# Patient Record
Sex: Female | Born: 1980 | Hispanic: Yes | Marital: Single | Smoking: Former smoker
Health system: Southern US, Community
[De-identification: ages and names within clinical notes are randomized; demographics above are authoritative.]

## PROBLEM LIST (undated history)

## (undated) DIAGNOSIS — G4730 Sleep apnea, unspecified: Secondary | ICD-10-CM

---

## 2014-09-03 ENCOUNTER — Encounter: Payer: Self-pay | Admitting: Obstetrics & Gynecology

## 2014-09-03 ENCOUNTER — Ambulatory Visit (INDEPENDENT_AMBULATORY_CARE_PROVIDER_SITE_OTHER): Payer: BLUE CROSS/BLUE SHIELD | Admitting: Obstetrics & Gynecology

## 2014-09-03 VITALS — BP 106/69 | HR 83 | Ht 64.0 in | Wt 146.0 lb

## 2014-09-03 DIAGNOSIS — Z30430 Encounter for insertion of intrauterine contraceptive device: Secondary | ICD-10-CM | POA: Diagnosis not present

## 2014-09-03 DIAGNOSIS — Z01812 Encounter for preprocedural laboratory examination: Secondary | ICD-10-CM

## 2014-09-03 LAB — POCT URINE PREGNANCY: Preg Test, Ur: NEGATIVE

## 2014-09-03 NOTE — Progress Notes (Signed)
   Subjective:    Patient ID: Kendra Martin, female    DOB: 07-04-1980, 34 y.o.   MRN: [id number]  HPI  34 yo lady is here to have Mirena inserted. She is waitlisted at [employer] and she doesn't want a pregnancy any time soon. She is currently using Nuvaring.  Review of Systems She reports a recent normal pap smear at student health.    Objective:   Physical Exam  UPT negative, consent signed, Time out procedure done. Cervix prepped with betadine and grasped with a single tooth tenaculum. Mirena was easily placed and the strings were cut to 3-4 cm. Uterus sounded to 9 cm. She tolerated the procedure well.        Assessment & Plan:  Contraception- Mirena

## 2015-03-27 ENCOUNTER — Emergency Department
Admission: EM | Admit: 2015-03-27 | Discharge: 2015-03-27 | Disposition: A | Discharge status: Home / Self Care | Payer: Worker's Compensation | Attending: Emergency Medicine | Admitting: Emergency Medicine

## 2015-03-27 ENCOUNTER — Encounter: Payer: Self-pay | Admitting: Emergency Medicine

## 2015-03-27 DIAGNOSIS — S61052A Open bite of left thumb without damage to nail, initial encounter: Secondary | ICD-10-CM | POA: Insufficient documentation

## 2015-03-27 DIAGNOSIS — W5501XA Bitten by cat, initial encounter: Secondary | ICD-10-CM | POA: Diagnosis not present

## 2015-03-27 DIAGNOSIS — Y9289 Other specified places as the place of occurrence of the external cause: Secondary | ICD-10-CM | POA: Diagnosis not present

## 2015-03-27 DIAGNOSIS — Y9389 Activity, other specified: Secondary | ICD-10-CM | POA: Diagnosis not present

## 2015-03-27 DIAGNOSIS — Z23 Encounter for immunization: Secondary | ICD-10-CM | POA: Diagnosis not present

## 2015-03-27 DIAGNOSIS — Y990 Civilian activity done for income or pay: Secondary | ICD-10-CM | POA: Diagnosis not present

## 2015-03-27 DIAGNOSIS — S61032A Puncture wound without foreign body of left thumb without damage to nail, initial encounter: Secondary | ICD-10-CM | POA: Insufficient documentation

## 2015-03-27 MED ORDER — AMOXICILLIN-POT CLAVULANATE 875-125 MG PO TABS: 1.0000 | ORAL_TABLET | Freq: Two times a day (BID) | ORAL | Status: AC

## 2015-03-27 MED ORDER — AMOXICILLIN-POT CLAVULANATE 875-125 MG PO TABS
1.0000 | ORAL_TABLET | Freq: Once | ORAL | Status: AC
  Administered 2015-03-27: 1 via ORAL
  Filled 2015-03-27: qty 1

## 2015-03-27 MED ORDER — IBUPROFEN 800 MG PO TABS: 800.0000 mg | ORAL_TABLET | Freq: Three times a day (TID) | ORAL | Status: AC | PRN

## 2015-03-27 MED ORDER — RABIES IMMUNE GLOBULIN 150 UNIT/ML IM INJ
20.0000 [IU]/kg | INJECTION | Freq: Once | INTRAMUSCULAR | Status: AC
  Administered 2015-03-27: 1425 [IU] via INTRAMUSCULAR
  Filled 2015-03-27: qty 9.5

## 2015-03-27 MED ORDER — RABIES VACCINE, PCEC IM SUSR
1.0000 mL | Freq: Once | INTRAMUSCULAR | Status: AC
  Administered 2015-03-27: 1 mL via INTRAMUSCULAR
  Filled 2015-03-27: qty 1

## 2015-03-27 NOTE — ED Notes (Signed)
Pt here with a cat bite on her hand, pt states that she was bitten by the cat at work today. Pt works at [employer]. 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

## 2015-03-27 NOTE — ED Notes (Signed)
Per Patient's workers comp claim, patient does not need urine drug screen, breath analysis, or blood alcohol completed.

## 2015-03-27 NOTE — ED Provider Notes (Signed)
[facility] Emergency Department Provider Note  ____________________________________________  Time seen: Approximately 5:48 PM  I have reviewed the triage vital signs and the nursing notes.   HISTORY  Chief Complaint Animal Bite   HPI Kendra Martin is a 34 y.o. 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 Was euthanized with the head sent to [facility] for evaluation. Patient presents for rabies postexposure vaccination series. Patient states that she copiously cleaned and washed and irrigated the area for 5 minutes post bite. No active bleeding at this time.   History reviewed. No pertinent past medical history.  There are no active problems to display for this patient.   History reviewed. No pertinent past surgical history.  Current Outpatient Rx  Name  Route  Sig  Dispense  Refill  . amoxicillin-clavulanate (AUGMENTIN) 875-125 MG tablet   Oral   Take 1 tablet by mouth 2 (two) times daily.   14 tablet   0   . ibuprofen (ADVIL,MOTRIN) 800 MG tablet   Oral   Take 1 tablet (800 mg total) by mouth every 8 (eight) hours as needed.   30 tablet   0   . NUVARING 0.12-0.015 MG/24HR vaginal ring            3     Dispense as written.     Allergies Review of patient's allergies indicates no known allergies.  Family History  Problem Relation Age of Onset  . Alcohol abuse Paternal Grandfather   . Diabetes Paternal Grandmother   . Heart disease Paternal Grandmother   . Heart attack Paternal Grandmother   . Alcohol abuse Maternal Grandfather   . Hyperlipidemia Father   . Hypertension Mother     Social History Social History  Substance Use Topics  . Smoking status: Former Games developer  . Smokeless tobacco: Never Used  . Alcohol Use: 0.0 oz/week    0 Standard drinks or equivalent per week     Comment: social    Review of Systems Constitutional:  No fever/chills Eyes: No visual changes. ENT: No sore throat. Cardiovascular: Denies chest pain. Respiratory: Denies shortness of breath. Gastrointestinal: No abdominal pain.  No nausea, no vomiting.  No diarrhea.  No constipation. Genitourinary: Negative for dysuria. Musculoskeletal: Negative for back pain. Skin: The thumb with puncture wound noted. Neurological: Negative for headaches, focal weakness or numbness.  10-point ROS otherwise negative.  ____________________________________________   PHYSICAL EXAM:  VITAL SIGNS: ED Triage Vitals  Enc Vitals Group     BP --      Pulse --      Resp --      Temp --      Temp src --      SpO2 --      Weight --      Height --      Head Cir --      Peak Flow --      Pain Score --      Pain Loc --      Pain Edu? --      Excl. in GC? --     Constitutional: Alert and oriented. Well appearing and in no acute distress.  Cardiovascular: Normal rate, regular rhythm. Grossly normal heart sounds.  Good peripheral circulation. Respiratory: Normal respiratory effort.  No retractions. Lungs CTAB. Musculoskeletal: No lower extremity tenderness nor edema.  No joint effusions. Neurologic:  Normal speech and  language. No gross focal neurologic deficits are appreciated. No gait instability. Skin:  Skin is warm, dry and intact. No bleeding noted. No evidence of infection at this time. Psychiatric: Mood and affect are normal. Speech and behavior are normal.  ____________________________________________   LABS (all labs ordered are listed, but only abnormal results are displayed)  Labs Reviewed - No data to display ____________________________________________   PROCEDURES  Procedure(s) performed: None  Critical Care performed: No  ____________________________________________   INITIAL IMPRESSION / ASSESSMENT AND PLAN / ED COURSE  Pertinent labs & imaging results that were available during my care of the patient were reviewed by me  and considered in my medical decision making (see chart for details).  Cat Bite Rx given for Augmentin 875 twice a day rabies vaccination series started. She is to return as directed as she voices no other emergency medical complaints at this time. ____________________________________________   FINAL CLINICAL IMPRESSION(S) / ED DIAGNOSES  Final diagnoses:  Cat bite, initial encounter      Evangeline Dakin, PA-C 03/27/15 1957  Sharyn Creamer, MD 04/05/15 [phone]

## 2015-03-27 NOTE — Discharge Instructions (Signed)

## 2015-03-30 ENCOUNTER — Encounter: Payer: Self-pay | Admitting: Emergency Medicine

## 2015-03-30 ENCOUNTER — Emergency Department
Admission: EM | Admit: 2015-03-30 | Discharge: 2015-03-30 | Disposition: A | Discharge status: Home / Self Care | Payer: Worker's Compensation | Attending: Emergency Medicine | Admitting: Emergency Medicine

## 2015-03-30 DIAGNOSIS — Z23 Encounter for immunization: Secondary | ICD-10-CM | POA: Diagnosis present

## 2015-03-30 DIAGNOSIS — Z87891 Personal history of nicotine dependence: Secondary | ICD-10-CM | POA: Diagnosis not present

## 2015-03-30 DIAGNOSIS — Z79899 Other long term (current) drug therapy: Secondary | ICD-10-CM | POA: Diagnosis not present

## 2015-03-30 DIAGNOSIS — Z792 Long term (current) use of antibiotics: Secondary | ICD-10-CM | POA: Diagnosis not present

## 2015-03-30 MED ORDER — RABIES VACCINE, PCEC IM SUSR
1.0000 mL | Freq: Once | INTRAMUSCULAR | Status: AC
  Administered 2015-03-30: 1 mL via INTRAMUSCULAR
  Filled 2015-03-30: qty 1

## 2015-03-30 NOTE — ED Notes (Signed)
PT returned to the ER to receive second rabies vaccine after presenting to this ER last week with an animal bite

## 2015-03-30 NOTE — ED Notes (Signed)
Cat bite 9/28

## 2015-03-30 NOTE — ED Provider Notes (Signed)
[facility] Emergency Department Provider Note ____________________________________________  Time seen: 2030  I have reviewed the triage vital signs and the nursing notes.  HISTORY  Chief Complaint  Rabies Injection  HPI Kendra Martin is a 34 y.o. female reports to the ED for rabies vaccine #2 of 4. She denies any fever, chills, sweats, or vaccine reaction in the interim.  History reviewed. No pertinent past medical history.  There are no active problems to display for this patient.  History reviewed. No pertinent past surgical history.  Current Outpatient Rx  Name  Route  Sig  Dispense  Refill  . amoxicillin-clavulanate (AUGMENTIN) 875-125 MG tablet   Oral   Take 1 tablet by mouth 2 (two) times daily.   14 tablet   0   . ibuprofen (ADVIL,MOTRIN) 800 MG tablet   Oral   Take 1 tablet (800 mg total) by mouth every 8 (eight) hours as needed.   30 tablet   0   . NUVARING 0.12-0.015 MG/24HR vaginal ring            3     Dispense as written.    Allergies Review of patient's allergies indicates no known allergies.  Family History  Problem Relation Age of Onset  . Alcohol abuse Paternal Grandfather   . Diabetes Paternal Grandmother   . Heart disease Paternal Grandmother   . Heart attack Paternal Grandmother   . Alcohol abuse Maternal Grandfather   . Hyperlipidemia Father   . Hypertension Mother    Social History Social History  Substance Use Topics  . Smoking status: Former Games developer  . Smokeless tobacco: Never Used  . Alcohol Use: 0.0 oz/week    0 Standard drinks or equivalent per week     Comment: social   Review of Systems  Constitutional: Negative for fever. Eyes: Negative for visual changes. ENT: Negative for sore throat. Cardiovascular: Negative for chest pain. Respiratory: Negative for shortness of breath. Gastrointestinal: Negative for abdominal pain, vomiting and diarrhea. Genitourinary: Negative for dysuria. Musculoskeletal:  Negative for back pain. Skin: Negative for rash. Neurological: Negative for headaches, focal weakness or numbness. ____________________________________________  PHYSICAL EXAM:  VITAL SIGNS: ED Triage Vitals  Enc Vitals Group     BP 03/30/15 1843 116/73 mmHg     Pulse Rate 03/30/15 1843 86     Resp 03/30/15 1843 20     Temp 03/30/15 1843 98.1 F (36.7 C)     Temp Source 03/30/15 1843 Oral     SpO2 03/30/15 1843 98 %     Weight 03/30/15 1843 155 lb (70.308 kg)     Height 03/30/15 1843  (1.626 m)     Head Cir --      Peak Flow --      Pain Score --      Pain Loc --      Pain Edu? --      Excl. in GC? --    Constitutional: Alert and oriented. Well appearing and in no distress. Eyes: Conjunctivae are normal. PERRL. Normal extraocular movements. ENT   Head: Normocephalic and atraumatic.   Nose: No congestion/rhinorrhea.   Mouth/Throat: Mucous membranes are moist.   Neck: Supple. No thyromegaly. Hematological/Lymphatic/Immunological: No cervical lymphadenopathy. Cardiovascular: Normal rate, regular rhythm.  Respiratory: Normal respiratory effort. No wheezes/rales/rhonchi. Gastrointestinal: Soft and nontender. No distention. Musculoskeletal: Nontender with normal range of motion in all extremities.  Neurologic:  Normal gait without ataxia. Normal speech and language. No gross focal neurologic deficits are appreciated. Skin:  Skin is warm, dry and intact. No rash noted. Psychiatric: Mood and affect are normal. Patient exhibits appropriate insight and judgment. ____________________________________________  PROCEDURES  Rabavert IM x 1 ____________________________________________  INITIAL IMPRESSION / ASSESSMENT AND PLAN / ED COURSE  Encounter for rabies vaccine #2 of 4. Patient will return to the ED for vaccine #3 of 4 on 04/03/15. She will return for vaccine #4 of 4 on 04/10/15. ____________________________________________  FINAL CLINICAL IMPRESSION(S) / ED  DIAGNOSES  Final diagnoses:  Need for immunization against rabies     Lissa Hoard, PA-C 03/30/15 2134  Minna Antis, MD 03/30/15 2325

## 2015-03-30 NOTE — Discharge Instructions (Signed)
Rabies Vaccine What You Need to Know WHAT IS RABIES?  Rabies is a serious disease. It is caused by a virus.  Rabies is mainly a disease of animals. Humans get rabies when they are bitten by infected animals.  At first there might not be any symptoms. But weeks, or even years after a bite, rabies can cause pain, fatigue, headaches, fever, and irritability. These are followed by seizures, hallucinations, and paralysis. Human rabies is almost always fatal.  Wild animals, especially bats, are the most common source of human rabies infection in the [place]. Skunks, raccoons, dogs, cats, coyotes, foxes, and other mammals can also transmit the disease.  Human rabies is rare in the [place]. There have been only 43 cases diagnosed since 1990. However, between 16,000 and 39,000 people are vaccinated each year as a precaution after animal bites. Also, rabies is far more common in other parts of the world, with about 40,000 to 70,000 rabies-related deaths worldwide each year. Bites from unvaccinated dogs cause most of these cases. Rabies vaccine can prevent rabies. RABIES VACCINE  Rabies vaccine is given to people at high risk of rabies to protect them if they are exposed. It can also prevent the disease if it is given to a person after they have been exposed.  Rabies vaccine is made from killed rabies virus. It cannot cause rabies. WHO SHOULD GET RABIES VACCINE AND WHEN? Preventive Vaccination (No Exposure)  People at high risk of exposure to rabies, such as veterinarians, Insurance account manager, rabies laboratory workers, spelunkers, and rabies biologics production workers should be offered rabies vaccine.  The vaccine should also be considered for:  People whose activities bring them into frequent contact with rabies virus or with possibly rabid animals.  International travelers who are likely to come in contact with animals in parts of the world where rabies is common.  The pre-exposure  schedule for rabies vaccination is 3 doses, given at the following times:  Dose 1: As appropriate.  Dose 2: 7 days after Dose 1.  Dose 3: 21 days or 28 days after Dose 1.  For laboratory workers and others who may be repeatedly exposed to rabies virus, periodic testing for immunity is recommended and booster doses should be given as needed. (Testing or booster doses are not recommended for travelers). Ask your doctor for details. Vaccination After an Exposure Anyone who has been bitten by an animal, or who otherwise may have been exposed to rabies, should clean the wound and see a doctor immediately. The doctor will determine if they need to be vaccinated. A person who is exposed and has never been vaccinated against rabies should get 4 doses of rabies vaccine: one dose right away and additional doses on the 3rd, 7th, and 14th days. They should also get another shot called Rabies Immune Globulin at the same time as the first dose.  A person who has been previously vaccinated should get 2 doses of rabies vaccine: one right away and another on the 3rd day. Rabies Immune Globulin is not needed. TELL YOUR DOCTOR IF: Talk with a doctor before getting rabies vaccine if you:  Ever had a serious (life-threatening) allergic reaction to a previous dose of rabies vaccine or to any component of the vaccine; tell your doctor if you have any severe allergies.  Have a weakened immune system because of:  HIV, AIDS, or another disease that affects the immune system.  Treatment with drugs that affect the immune system, such as steroids.  Cancer  or cancer treatment with radiation or drugs. If you have a minor illness, such as a cold, you can be vaccinated. If you are moderately or severely ill, you should probably wait until you recover before getting a routine (non-exposure) dose of rabies vaccine. If you have been exposed to rabies virus, you should get the vaccine regardless of any other illnesses you may  have. WHAT ARE THE RISKS FROM RABIES VACCINE? A vaccine, like any medicine, is capable of causing serious problems, such as severe allergic reactions. The risk of a vaccine causing serious harm, or death, is extremely small. Serious problems from rabies vaccine are very rare.  Mild problems:  Soreness, redness, swelling, or itching where the shot was given (30% to 74%).  Headache, nausea, abdominal pain, muscle aches, or dizziness (5% to 40%). Moderate problems:  Hives, pain in the joints, or fever (about 6% of booster doses).  Other nervous system disorders, such as Guillain-Barr Syndrome (GBS), have been reported after rabies vaccine, but this happens so rarely that it is not known whether they are related to the vaccine. Note: Several brands of rabies vaccine are available in the [place], and reactions may vary between brands. Your provider can give you more information about a particular brand. WHAT IF THERE IS A SERIOUS REACTION? What should I look for? Look for anything that concerns you, such as signs of a severe allergic reaction, very high fever, or behavior changes.  Signs of a severe allergic reaction can include hives, swelling of the face and throat, difficulty breathing, a fast heartbeat, dizziness, and weakness. These would start a few minutes to a few hours after the vaccination. What should I do?  If you think it is a severe allergic reaction or other emergency that cannot wait, call 911 or get the person to the nearest hospital. Otherwise, call your doctor.  Afterward, the reaction should be reported to the Vaccine Adverse Event Reporting System (VAERS). Your doctor might file this report, or you can do it yourself through the VAERS website at www.vaers.LAgents.no or by calling 1-463 594 6084. VAERS is only for reporting reactions. They do not give medical advice. HOW CAN I LEARN MORE?  Ask your doctor.  Call your local or state health department.  Contact the  Centers for Disease Control and Prevention (CDC):  Visit the CDC rabies website at wwwcrv.com CDC Rabies Vaccine VIS (04/03/08) Document Released: 04/12/2006 Document Revised: 06/01/2012 Document Reviewed: 10/05/2012 [facility] Patient Information 2015 [place], [place]. This information is not intended to replace advice given to you by your health care provider. Make sure you discuss any questions you have with your health care provider.   Return for your remaining vaccines on the following schedule:  Day 0       03/27/15    Done Day 3       03/30/15    Done Day 7       04/03/15 Day 14     04/10/15

## 2015-04-03 ENCOUNTER — Encounter: Payer: Self-pay | Admitting: *Deleted

## 2015-04-03 ENCOUNTER — Emergency Department
Admission: EM | Admit: 2015-04-03 | Discharge: 2015-04-03 | Disposition: A | Discharge status: Home / Self Care | Payer: Worker's Compensation | Attending: Emergency Medicine | Admitting: Emergency Medicine

## 2015-04-03 DIAGNOSIS — Z792 Long term (current) use of antibiotics: Secondary | ICD-10-CM | POA: Insufficient documentation

## 2015-04-03 DIAGNOSIS — Z23 Encounter for immunization: Secondary | ICD-10-CM

## 2015-04-03 DIAGNOSIS — Z87891 Personal history of nicotine dependence: Secondary | ICD-10-CM | POA: Insufficient documentation

## 2015-04-03 MED ORDER — RABIES VACCINE, PCEC IM SUSR
1.0000 mL | Freq: Once | INTRAMUSCULAR | Status: AC
  Administered 2015-04-03: 1 mL via INTRAMUSCULAR
  Filled 2015-04-03: qty 1

## 2015-04-03 NOTE — Discharge Instructions (Signed)
Rabies °Rabies is a viral infection that can be spread to people from infected animals. The infection affects the brain and central nervous system. Once the disease develops, it almost always causes death. Because of this, when a person is bitten by an animal that may have rabies, treatment to prevent rabies often needs to be started whether or not the animal is known to be infected. Prompt treatment with the rabies vaccine and rabies immune globulin is very effective at preventing the infection from developing in people who have been exposed to the rabies virus. °CAUSES  °Rabies is caused by a virus that lives inside some animals. When a person is bitten by an infected animal, the rabies virus is spread to the person through the infected spit (saliva) of the animal. This virus can be carried by animals such as dogs, cats, skunks, bats, woodchucks, raccoons, coyotes, and foxes. °SYMPTOMS  °By the time symptoms appear, rabies is usually fatal for the person. Common symptoms include: °· Headache. °· Fever. °· Fatigue and weakness. °· Agitation. °· Anxiety. °· Confusion. °· Unusual behavior, such as hyperactivity, fear of water (hydrophobia), or fear of air (aerophobia). °· Hallucinations. °· Insomnia. °· Weakness in the arms or legs. °· Difficulty swallowing. °Most people get sick in 1-3 months after being bitten. This often varies and may depend on the location of the bite. The infection will take less time to develop if the bite occurred closer to the head.  °DIAGNOSIS  °To determine if a person is infected, several tests must be performed, such as: °· A skin biopsy. °· A saliva test. °· A lumbar puncture to remove spinal fluid so it can be examined. °· Blood tests. °TREATMENT  °Treatment to prevent the infection from developing (post-exposure prophylaxis, PEP) is often started before knowing for sure if the person has been exposed to the rabies virus. PEP involves cleaning the wound, giving an antibody injection  (rabies immune globulin), and giving a series of rabies vaccine injections. The series of injections are usually given over a two-week period. If possible, the animal that bit the person will be observed to see if it remains healthy. If the animal has been killed, it can be sent to a state laboratory and examined to see if the animal had rabies. °If a person is bitten by a domestic animal (dog, cat, or ferret) that appears healthy and can be observed to see if it remains healthy, often no further treatment is necessary other than care of the wounds caused by the animal. °Rabies is often a fatal illness once the infection develops in a person. Although a few people who developed rabies have survived after experimental treatment with certain drugs, all these survivors still had severe nervous system problems after the treatment. This is why caregivers use extra caution and begin PEP treatment for people who have been bitten by animals that are possibly infected with rabies.  °HOME CARE INSTRUCTIONS  °If you were bitten by an unknown animal, make sure you know your caregiver's instructions for follow-up. If the animal was sent to a laboratory for examination, ask when the test results will be ready. Make sure you get the test results.  °Take these steps to care for your wound: °· Keep the wound clean, dry, and dressed as directed by your caregiver. °· Keep the injured part elevated as much as possible. °· Do not resume use of the affected area until directed. °· Only take over-the-counter or prescription medicines as directed by your   caregiver. °· Keep all follow-up appointments as directed by your caregiver. °PREVENTION  °To prevent rabies, people need to reduce their risk of having contact with infected animals.  °· Make sure your pets (dogs, cats, ferrets) are vaccinated against rabies. Keep these vaccinations up-to-date as directed by your veterinarian. °· Supervise your pets when they are outside. Keep them away  from wild animals. °· Call your local animal control services to report any stray animals. These animals may not be vaccinated. °· Stay away from stray or wild animals. °· Consider getting the rabies vaccine (preexposure) if you are traveling to an area where rabies is common or if your job or activities involve possible contact with wild or stray animals. Discuss this with your caregiver. °  °This information is not intended to replace advice given to you by your health care provider. Make sure you discuss any questions you have with your health care provider. °  °Document Released: 06/15/2005 Document Revised: 07/06/2014 Document Reviewed: 01/12/2012 °Elsevier Interactive Patient Education ©2016 [employer]. ° °

## 2015-04-03 NOTE — ED Notes (Signed)
Pt here for day 7 of rabies vaccine.

## 2015-04-03 NOTE — ED Provider Notes (Signed)
[facility] Emergency Department Provider Note ____________________________________________  Time seen: 2200  I have reviewed the triage vital signs and the nursing notes.  HISTORY  Chief Complaint  Rabies Injection  HPI Kendra Martin is a 34 y.o. female reports to the ED for rabies vaccine #3 or 4. She denies any interim fever, chills, sweats.  No past medical history on file.  There are no active problems to display for this patient.  No past surgical history on file.  Current Outpatient Rx  Name  Route  Sig  Dispense  Refill  . amoxicillin-clavulanate (AUGMENTIN) 875-125 MG tablet   Oral   Take 1 tablet by mouth 2 (two) times daily.   14 tablet   0   . ibuprofen (ADVIL,MOTRIN) 800 MG tablet   Oral   Take 1 tablet (800 mg total) by mouth every 8 (eight) hours as needed.   30 tablet   0   . NUVARING 0.12-0.015 MG/24HR vaginal ring            3     Dispense as written.    Allergies Review of patient's allergies indicates no known allergies.  Family History  Problem Relation Age of Onset  . Alcohol abuse Paternal Grandfather   . Diabetes Paternal Grandmother   . Heart disease Paternal Grandmother   . Heart attack Paternal Grandmother   . Alcohol abuse Maternal Grandfather   . Hyperlipidemia Father   . Hypertension Mother    Social History Social History  Substance Use Topics  . Smoking status: Former Games developer  . Smokeless tobacco: Never Used  . Alcohol Use: 0.0 oz/week    0 Standard drinks or equivalent per week     Comment: social   Review of Systems  Constitutional: Negative for fever. Eyes: Negative for visual changes. ENT: Negative for sore throat. Cardiovascular: Negative for chest pain. Respiratory: Negative for shortness of breath. Gastrointestinal: Negative for abdominal pain, vomiting and diarrhea. Genitourinary: Negative for dysuria. Musculoskeletal: Negative for back pain. Skin: Negative for rash. Neurological:  Negative for headaches, focal weakness or numbness. ____________________________________________  PHYSICAL EXAM:  VITAL SIGNS: ED Triage Vitals  Enc Vitals Group     BP 04/03/15 2106 133/70 mmHg     Pulse Rate 04/03/15 2106 77     Resp 04/03/15 2106 20     Temp 04/03/15 2106 98.4 F (36.9 C)     Temp Source 04/03/15 2106 Oral     SpO2 04/03/15 2106 98 %     Weight 04/03/15 2106 155 lb (70.308 kg)     Height 04/03/15 2106  (1.626 m)     Head Cir --      Peak Flow --      Pain Score --      Pain Loc --      Pain Edu? --      Excl. in GC? --    Constitutional: Alert and oriented. Well appearing and in no distress. Eyes: Conjunctivae are normal. PERRL. Normal extraocular movements. ENT   Head: Normocephalic and atraumatic.   Nose: No congestion/rhinorrhea.   Mouth/Throat: Mucous membranes are moist.   Neck: Supple. No thyromegaly. Hematological/Lymphatic/Immunological: No cervical lymphadenopathy. Cardiovascular: Normal rate, regular rhythm.  Respiratory: Normal respiratory effort. No wheezes/rales/rhonchi. Gastrointestinal: Soft and nontender. No distention. Musculoskeletal: Nontender with normal range of motion in all extremities.  Neurologic:  Normal gait without ataxia. Normal speech and language. No gross focal neurologic deficits are appreciated. Skin:  Skin is warm, dry and intact.  No rash noted. Psychiatric: Mood and affect are normal. Patient exhibits appropriate insight and judgment. ____________________________________________  PROCEDURES  Rabavert 1 mg IM ____________________________________________  INITIAL IMPRESSION / ASSESSMENT AND PLAN / ED COURSE  Patient presents for rabies flexing #3 or 4. She will return to the ED as scheduled for an injection #4 of 4. ____________________________________________  FINAL CLINICAL IMPRESSION(S) / ED DIAGNOSES  Final diagnoses:  Need for prophylactic vaccination against rabies      Lissa Hoard, PA-C 04/03/15 2227  Darien Ramus, MD 04/03/15 2351

## 2015-04-03 NOTE — ED Notes (Signed)
AAOx3.  Skin warm and dry.  NAD 

## 2015-04-03 NOTE — ED Notes (Signed)
AAOx3.  Skin warm and dry.  Ambulatates with easy and steady gait.  D/C home

## 2015-04-10 ENCOUNTER — Emergency Department
Admission: EM | Admit: 2015-04-10 | Discharge: 2015-04-10 | Disposition: A | Discharge status: Home / Self Care | Payer: Worker's Compensation | Attending: Emergency Medicine | Admitting: Emergency Medicine

## 2015-04-10 DIAGNOSIS — Z87891 Personal history of nicotine dependence: Secondary | ICD-10-CM | POA: Insufficient documentation

## 2015-04-10 DIAGNOSIS — Z23 Encounter for immunization: Secondary | ICD-10-CM | POA: Diagnosis not present

## 2015-04-10 MED ORDER — RABIES VACCINE, PCEC IM SUSR
1.0000 mL | Freq: Once | INTRAMUSCULAR | Status: AC
  Administered 2015-04-10: 1 mL via INTRAMUSCULAR
  Filled 2015-04-10: qty 1

## 2015-04-10 NOTE — ED Provider Notes (Signed)
[facility] Emergency Department Provider Note  ____________________________________________  Time seen: Approximately 7:25 PM  I have reviewed the triage vital signs and the nursing notes.   HISTORY  Chief Complaint Rabies Injection    HPI Kendra Martin is a 34 y.o. female presents for injection of her last rabies vaccination. Patient states no side effects from the other injections.   History reviewed. No pertinent past medical history.  There are no active problems to display for this patient.   History reviewed. No pertinent past surgical history.  Current Outpatient Rx  Name  Route  Sig  Dispense  Refill  . ibuprofen (ADVIL,MOTRIN) 800 MG tablet   Oral   Take 1 tablet (800 mg total) by mouth every 8 (eight) hours as needed.   30 tablet   0   . NUVARING 0.12-0.015 MG/24HR vaginal ring            3     Dispense as written.     Allergies Review of patient's allergies indicates no known allergies.  Family History  Problem Relation Age of Onset  . Alcohol abuse Paternal Grandfather   . Diabetes Paternal Grandmother   . Heart disease Paternal Grandmother   . Heart attack Paternal Grandmother   . Alcohol abuse Maternal Grandfather   . Hyperlipidemia Father   . Hypertension Mother     Social History Social History  Substance Use Topics  . Smoking status: Former Games developermoker  . Smokeless tobacco: Never Used  . Alcohol Use: 0.0 oz/week    0 Standard drinks or equivalent per week     Comment: social    Review of Systems Constitutional: No fever/chills Eyes: No visual changes. ENT: No sore throat. Cardiovascular: Denies chest pain. Respiratory: Denies shortness of breath. Gastrointestinal: No abdominal pain.  No nausea, no vomiting.  No diarrhea.  No constipation. Genitourinary: Negative for dysuria. Musculoskeletal: Negative for back pain. Skin: Negative for rash. Neurological: Negative for headaches, focal weakness or  numbness.  10-point ROS otherwise negative.  ____________________________________________   PHYSICAL EXAM:  VITAL SIGNS: ED Triage Vitals  Enc Vitals Group     BP 04/10/15 1832 111/84 mmHg     Pulse Rate 04/10/15 1832 88     Resp 04/10/15 1832 16     Temp 04/10/15 1832 98.5 F (36.9 C)     Temp Source 04/10/15 1832 Oral     SpO2 04/10/15 1832 99 %     Weight 04/10/15 1832 155 lb (70.308 kg)     Height 04/10/15 1832 5\' 4"  (1.626 m)     Head Cir --      Peak Flow --      Pain Score --      Pain Loc --      Pain Edu? --      Excl. in GC? --     Constitutional: Alert and oriented. Well appearing and in no acute distress.   Cardiovascular: Normal rate, regular rhythm. Grossly normal heart sounds.  Good peripheral circulation. Respiratory: Normal respiratory effort.  No retractions. Lungs CTAB. Musculoskeletal: No lower extremity tenderness nor edema.  No joint effusions. Neurologic:  Normal speech and language. No gross focal neurologic deficits are appreciated. No gait instability. Skin:  Skin is warm, dry and intact. No rash noted. Psychiatric: Mood and affect are normal. Speech and behavior are normal.  ____________________________________________   LABS (all labs ordered are listed, but only abnormal results are displayed)  Labs Reviewed - No data to display ____________________________________________  PROCEDURES  Procedure(s) performed: None  Critical Care performed: No  ____________________________________________   INITIAL IMPRESSION / ASSESSMENT AND PLAN / ED COURSE  Pertinent labs & imaging results that were available during my care of the patient were reviewed by me and considered in my medical decision making (see chart for details).  Rabies vaccination prophylaxis. This is the last injection in the series. She'll follow up as needed. ____________________________________________   FINAL CLINICAL IMPRESSION(S) / ED DIAGNOSES  Final diagnoses:   Need for prophylactic vaccination against rabies      Evangeline Dakin, PA-C 04/10/15 1931  Phineas Semen, MD 04/10/15 1952

## 2015-04-10 NOTE — ED Notes (Signed)
Patient here for last rabies injection.

## 2017-01-11 ENCOUNTER — Encounter: Payer: Self-pay | Admitting: Obstetrics & Gynecology

## 2017-01-12 ENCOUNTER — Other Ambulatory Visit: Payer: Self-pay | Admitting: Obstetrics and Gynecology

## 2017-01-12 DIAGNOSIS — Z1231 Encounter for screening mammogram for malignant neoplasm of breast: Secondary | ICD-10-CM

## 2017-02-01 ENCOUNTER — Ambulatory Visit
Admission: RE | Admit: 2017-02-01 | Discharge: 2017-02-01 | Disposition: A | Discharge status: Home / Self Care | Payer: BLUE CROSS/BLUE SHIELD | Source: Ambulatory Visit | Attending: Obstetrics and Gynecology | Admitting: Obstetrics and Gynecology

## 2017-02-01 ENCOUNTER — Encounter: Payer: Self-pay | Admitting: Radiology

## 2017-02-01 DIAGNOSIS — Z1231 Encounter for screening mammogram for malignant neoplasm of breast: Secondary | ICD-10-CM

## 2017-02-01 DIAGNOSIS — N6310 Unspecified lump in the right breast, unspecified quadrant: Secondary | ICD-10-CM | POA: Insufficient documentation

## 2017-02-01 DIAGNOSIS — R928 Other abnormal and inconclusive findings on diagnostic imaging of breast: Secondary | ICD-10-CM | POA: Diagnosis not present

## 2017-02-04 ENCOUNTER — Other Ambulatory Visit: Payer: Self-pay | Admitting: Obstetrics and Gynecology

## 2017-02-04 DIAGNOSIS — N6311 Unspecified lump in the right breast, upper outer quadrant: Secondary | ICD-10-CM

## 2017-02-04 DIAGNOSIS — R928 Other abnormal and inconclusive findings on diagnostic imaging of breast: Secondary | ICD-10-CM

## 2017-02-18 ENCOUNTER — Ambulatory Visit
Admission: RE | Admit: 2017-02-18 | Discharge: 2017-02-18 | Disposition: A | Discharge status: Home / Self Care | Payer: BLUE CROSS/BLUE SHIELD | Source: Ambulatory Visit | Attending: Obstetrics and Gynecology | Admitting: Obstetrics and Gynecology

## 2017-02-18 DIAGNOSIS — R928 Other abnormal and inconclusive findings on diagnostic imaging of breast: Secondary | ICD-10-CM | POA: Insufficient documentation

## 2017-02-18 DIAGNOSIS — N6311 Unspecified lump in the right breast, upper outer quadrant: Secondary | ICD-10-CM

## 2017-09-21 IMAGING — MG MM DIGITAL DIAGNOSTIC UNILAT*R* W/ TOMO W/ CAD
6 series · 6 of 14 positions shown · non-contrast
Comparison: Previous exam(s).

CLINICAL DATA: Callback from screening mammogram for possible
asymmetry right breast

EXAM:
2D DIGITAL DIAGNOSTIC UNILATERAL RIGHT MAMMOGRAM WITH CAD AND
ADJUNCT TOMO

[R CC synth-2D]
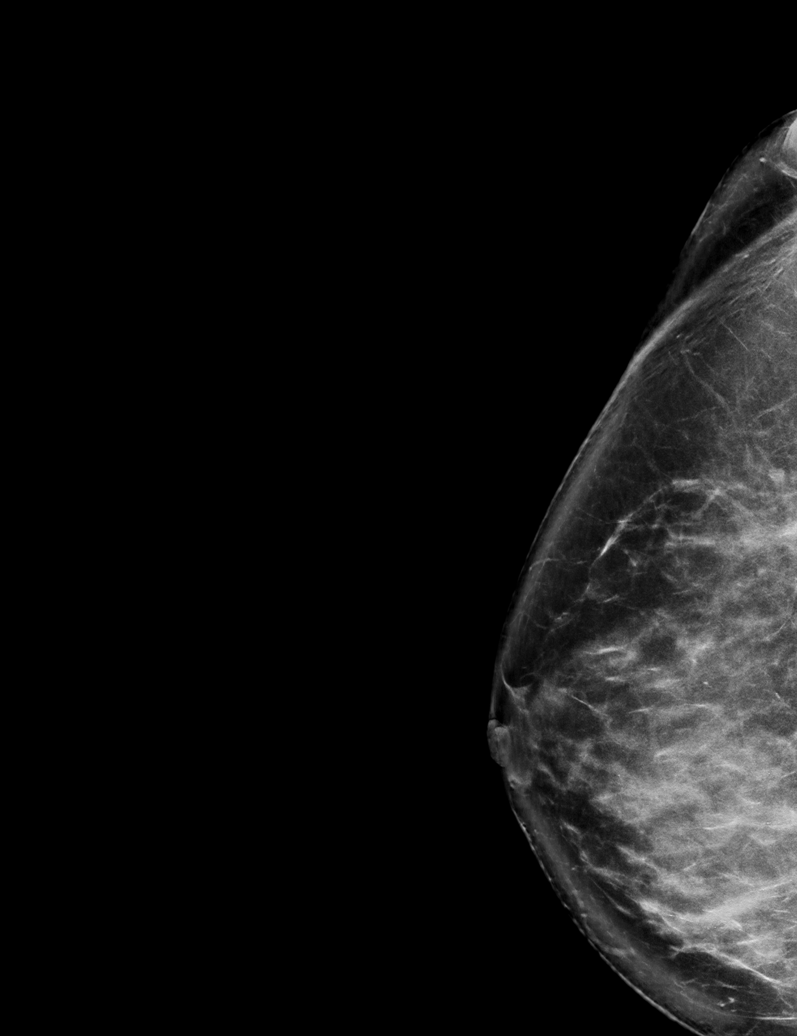

[R MLO synth-2D]
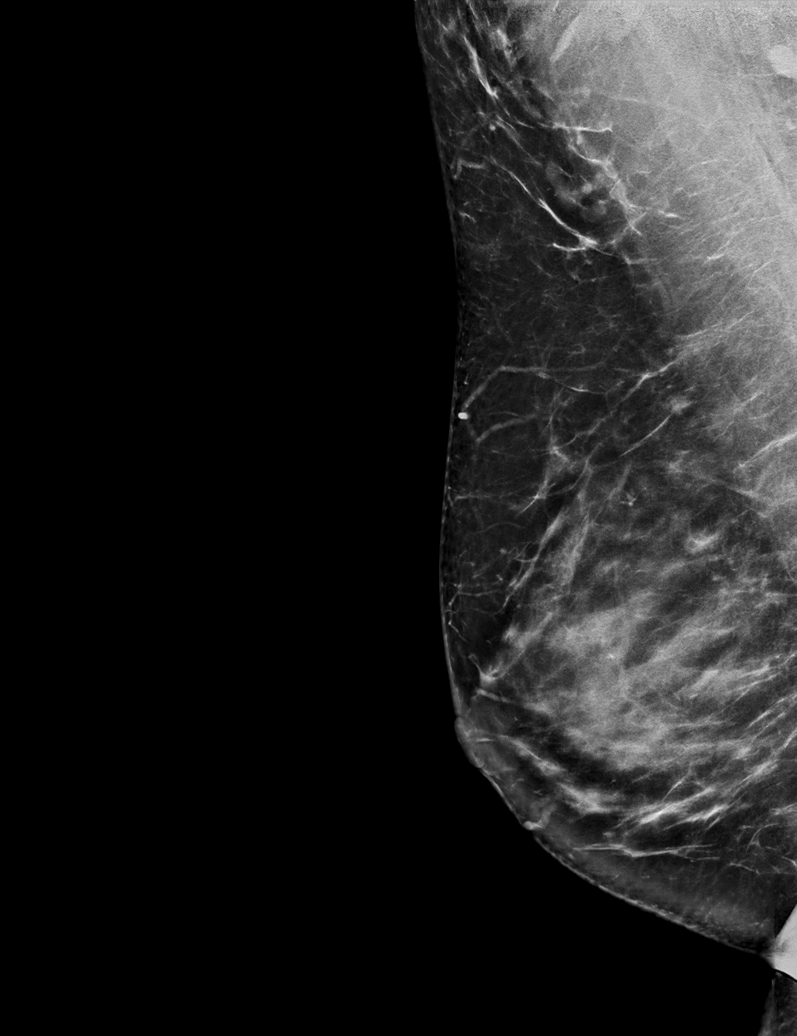

[R CC]
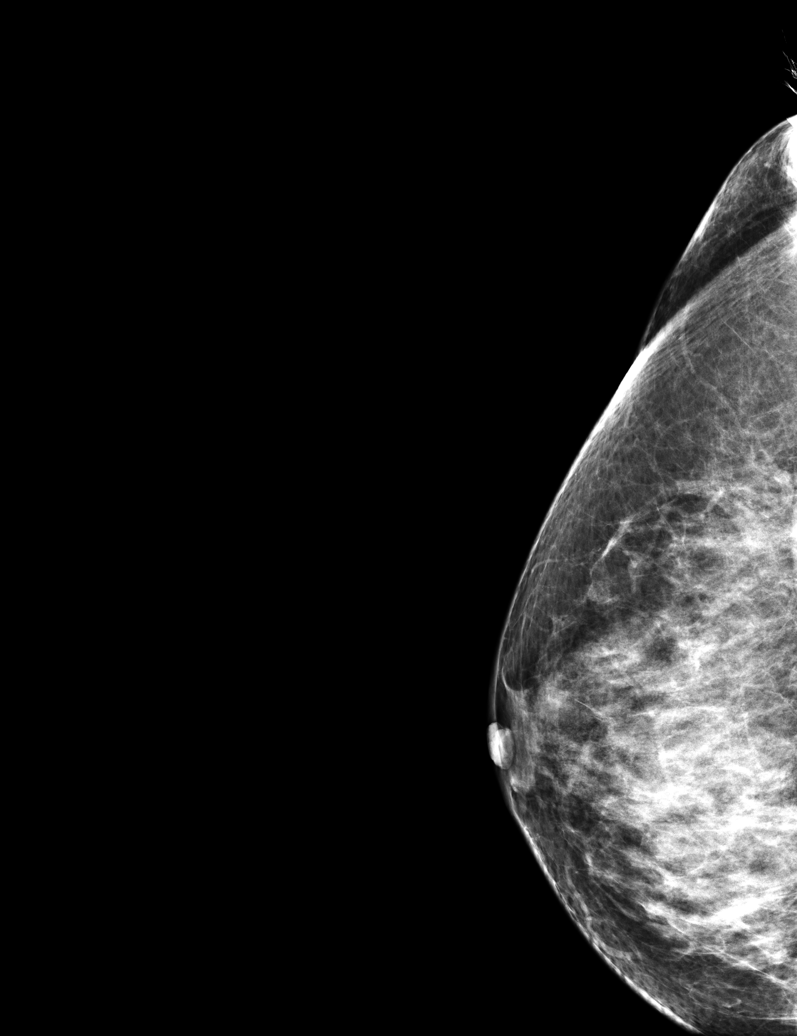

[R MLO]
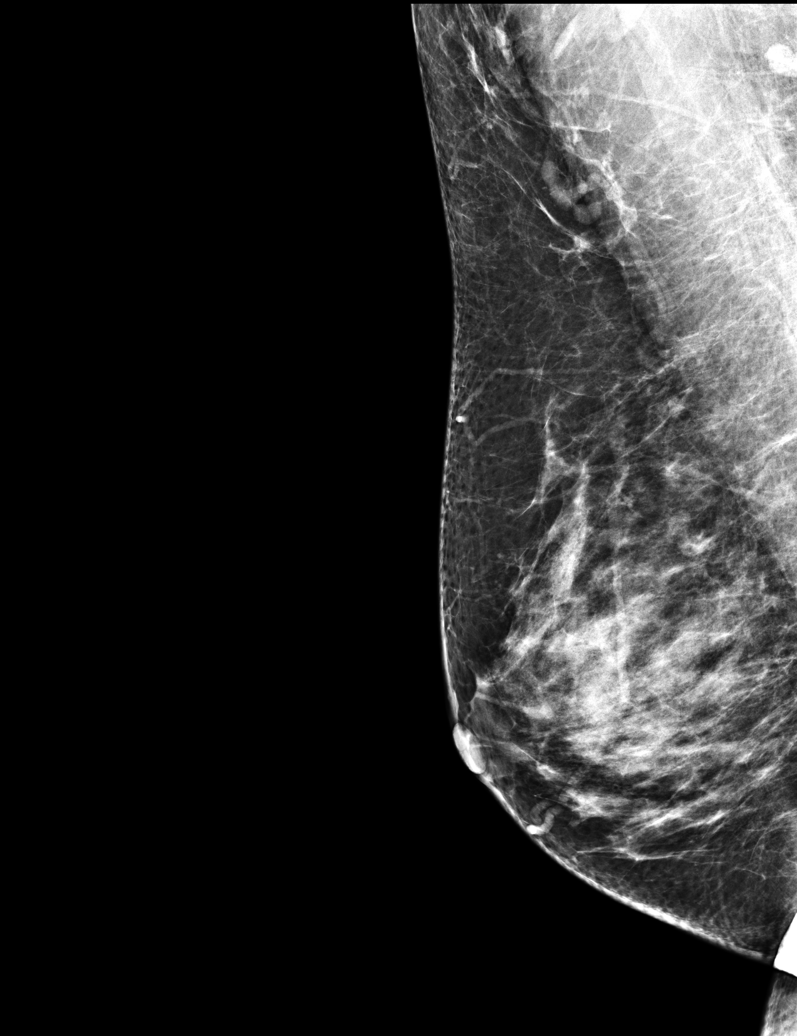

[R CC tomo · tomo slice 41/81.0]
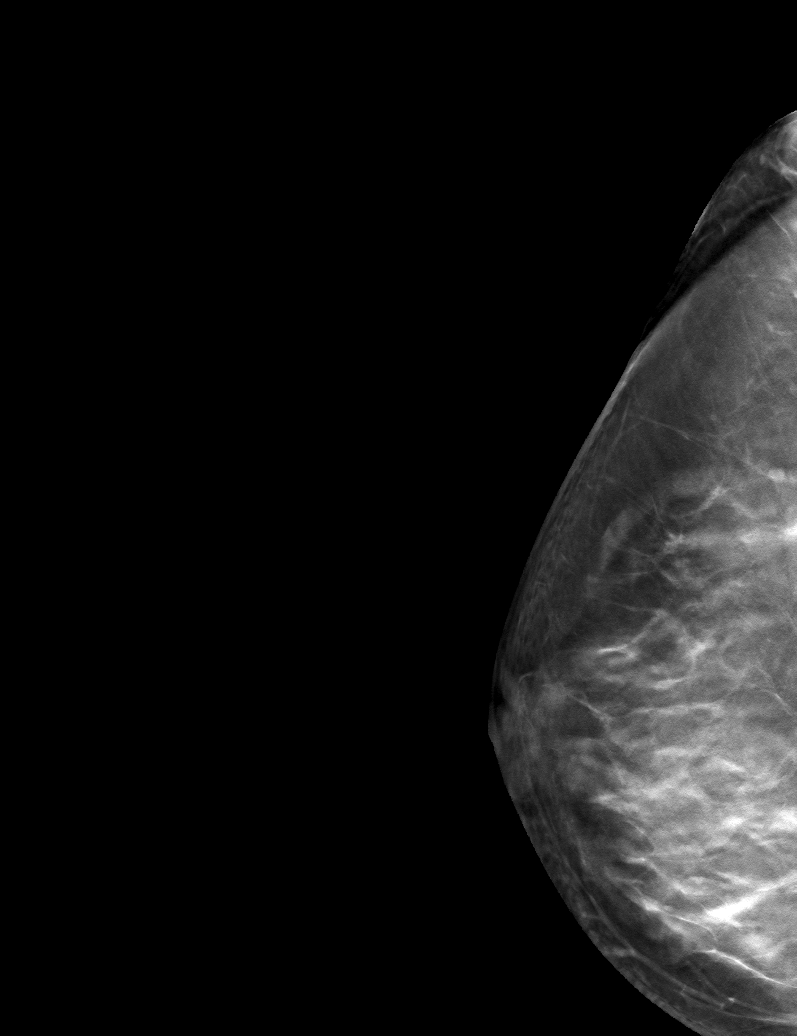

[R MLO tomo · tomo slice 41/81.0]
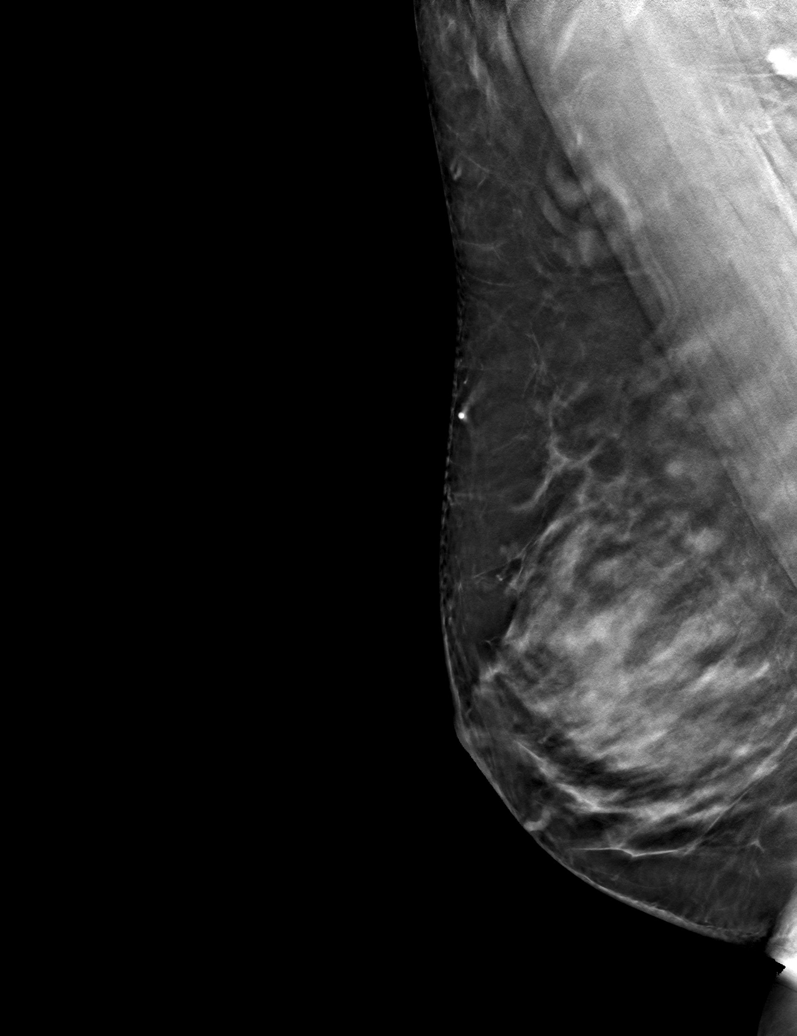

[6 of 14 positions shown; findings below may reference images not displayed]

ACR Breast Density Category c: The breast tissue is heterogeneously
dense, which may obscure small masses.
FINDINGS: Exaggerated right CC view, MLO view of the right breast are
submitted. Previously questioned asymmetry does not persist on
additional views. No suspicious abnormalities identified.

Mammographic images were processed with CAD.
IMPRESSION: Negative.

RECOMMENDATION:
Routine screening mammogram at age 38.

I have discussed the findings and recommendations with the patient.
Results were also provided in writing at the conclusion of the
visit. If applicable, a reminder letter will be sent to the patient
regarding the next appointment.

BI-RADS CATEGORY  1: Negative.

## 2019-11-04 ENCOUNTER — Ambulatory Visit: Payer: Self-pay

## 2020-08-07 DIAGNOSIS — Z111 Encounter for screening for respiratory tuberculosis: Secondary | ICD-10-CM | POA: Diagnosis not present

## 2020-08-21 DIAGNOSIS — F902 Attention-deficit hyperactivity disorder, combined type: Secondary | ICD-10-CM | POA: Diagnosis not present

## 2020-08-21 DIAGNOSIS — F322 Major depressive disorder, single episode, severe without psychotic features: Secondary | ICD-10-CM | POA: Diagnosis not present

## 2020-08-21 DIAGNOSIS — F411 Generalized anxiety disorder: Secondary | ICD-10-CM | POA: Diagnosis not present

## 2020-09-13 DIAGNOSIS — Z6825 Body mass index (BMI) 25.0-25.9, adult: Secondary | ICD-10-CM | POA: Diagnosis not present

## 2020-09-13 DIAGNOSIS — Z87891 Personal history of nicotine dependence: Secondary | ICD-10-CM | POA: Diagnosis not present

## 2020-09-13 DIAGNOSIS — F909 Attention-deficit hyperactivity disorder, unspecified type: Secondary | ICD-10-CM | POA: Diagnosis not present

## 2020-09-13 DIAGNOSIS — Z79899 Other long term (current) drug therapy: Secondary | ICD-10-CM | POA: Diagnosis not present

## 2020-09-13 DIAGNOSIS — Z01419 Encounter for gynecological examination (general) (routine) without abnormal findings: Secondary | ICD-10-CM | POA: Diagnosis not present

## 2020-09-30 DIAGNOSIS — F411 Generalized anxiety disorder: Secondary | ICD-10-CM | POA: Diagnosis not present

## 2020-09-30 DIAGNOSIS — F322 Major depressive disorder, single episode, severe without psychotic features: Secondary | ICD-10-CM | POA: Diagnosis not present

## 2020-09-30 DIAGNOSIS — F902 Attention-deficit hyperactivity disorder, combined type: Secondary | ICD-10-CM | POA: Diagnosis not present

## 2020-10-09 DIAGNOSIS — F322 Major depressive disorder, single episode, severe without psychotic features: Secondary | ICD-10-CM | POA: Diagnosis not present

## 2020-10-09 DIAGNOSIS — F902 Attention-deficit hyperactivity disorder, combined type: Secondary | ICD-10-CM | POA: Diagnosis not present

## 2020-10-09 DIAGNOSIS — F411 Generalized anxiety disorder: Secondary | ICD-10-CM | POA: Diagnosis not present

## 2020-10-16 DIAGNOSIS — F322 Major depressive disorder, single episode, severe without psychotic features: Secondary | ICD-10-CM | POA: Diagnosis not present

## 2020-10-16 DIAGNOSIS — F902 Attention-deficit hyperactivity disorder, combined type: Secondary | ICD-10-CM | POA: Diagnosis not present

## 2020-10-16 DIAGNOSIS — F411 Generalized anxiety disorder: Secondary | ICD-10-CM | POA: Diagnosis not present

## 2020-10-17 DIAGNOSIS — F411 Generalized anxiety disorder: Secondary | ICD-10-CM | POA: Diagnosis not present

## 2020-10-17 DIAGNOSIS — F322 Major depressive disorder, single episode, severe without psychotic features: Secondary | ICD-10-CM | POA: Diagnosis not present

## 2020-10-17 DIAGNOSIS — F902 Attention-deficit hyperactivity disorder, combined type: Secondary | ICD-10-CM | POA: Diagnosis not present

## 2020-10-24 DIAGNOSIS — F902 Attention-deficit hyperactivity disorder, combined type: Secondary | ICD-10-CM | POA: Diagnosis not present

## 2020-10-24 DIAGNOSIS — F411 Generalized anxiety disorder: Secondary | ICD-10-CM | POA: Diagnosis not present

## 2020-10-24 DIAGNOSIS — F322 Major depressive disorder, single episode, severe without psychotic features: Secondary | ICD-10-CM | POA: Diagnosis not present

## 2020-10-30 DIAGNOSIS — F411 Generalized anxiety disorder: Secondary | ICD-10-CM | POA: Diagnosis not present

## 2020-10-30 DIAGNOSIS — F322 Major depressive disorder, single episode, severe without psychotic features: Secondary | ICD-10-CM | POA: Diagnosis not present

## 2020-10-30 DIAGNOSIS — F902 Attention-deficit hyperactivity disorder, combined type: Secondary | ICD-10-CM | POA: Diagnosis not present

## 2020-11-07 DIAGNOSIS — F411 Generalized anxiety disorder: Secondary | ICD-10-CM | POA: Diagnosis not present

## 2020-11-07 DIAGNOSIS — F322 Major depressive disorder, single episode, severe without psychotic features: Secondary | ICD-10-CM | POA: Diagnosis not present

## 2020-11-07 DIAGNOSIS — F902 Attention-deficit hyperactivity disorder, combined type: Secondary | ICD-10-CM | POA: Diagnosis not present

## 2020-12-12 DIAGNOSIS — F902 Attention-deficit hyperactivity disorder, combined type: Secondary | ICD-10-CM | POA: Diagnosis not present

## 2020-12-12 DIAGNOSIS — F322 Major depressive disorder, single episode, severe without psychotic features: Secondary | ICD-10-CM | POA: Diagnosis not present

## 2020-12-12 DIAGNOSIS — F411 Generalized anxiety disorder: Secondary | ICD-10-CM | POA: Diagnosis not present

## 2020-12-23 DIAGNOSIS — F411 Generalized anxiety disorder: Secondary | ICD-10-CM | POA: Diagnosis not present

## 2020-12-23 DIAGNOSIS — F322 Major depressive disorder, single episode, severe without psychotic features: Secondary | ICD-10-CM | POA: Diagnosis not present

## 2020-12-31 DIAGNOSIS — F902 Attention-deficit hyperactivity disorder, combined type: Secondary | ICD-10-CM | POA: Diagnosis not present

## 2020-12-31 DIAGNOSIS — F322 Major depressive disorder, single episode, severe without psychotic features: Secondary | ICD-10-CM | POA: Diagnosis not present

## 2020-12-31 DIAGNOSIS — F411 Generalized anxiety disorder: Secondary | ICD-10-CM | POA: Diagnosis not present

## 2021-01-08 DIAGNOSIS — F902 Attention-deficit hyperactivity disorder, combined type: Secondary | ICD-10-CM | POA: Diagnosis not present

## 2021-01-08 DIAGNOSIS — F411 Generalized anxiety disorder: Secondary | ICD-10-CM | POA: Diagnosis not present

## 2021-01-08 DIAGNOSIS — F322 Major depressive disorder, single episode, severe without psychotic features: Secondary | ICD-10-CM | POA: Diagnosis not present

## 2021-01-13 DIAGNOSIS — F902 Attention-deficit hyperactivity disorder, combined type: Secondary | ICD-10-CM | POA: Diagnosis not present

## 2021-01-13 DIAGNOSIS — F322 Major depressive disorder, single episode, severe without psychotic features: Secondary | ICD-10-CM | POA: Diagnosis not present

## 2021-01-13 DIAGNOSIS — F411 Generalized anxiety disorder: Secondary | ICD-10-CM | POA: Diagnosis not present

## 2021-01-21 DIAGNOSIS — F902 Attention-deficit hyperactivity disorder, combined type: Secondary | ICD-10-CM | POA: Diagnosis not present

## 2021-01-21 DIAGNOSIS — F322 Major depressive disorder, single episode, severe without psychotic features: Secondary | ICD-10-CM | POA: Diagnosis not present

## 2021-01-21 DIAGNOSIS — F411 Generalized anxiety disorder: Secondary | ICD-10-CM | POA: Diagnosis not present

## 2021-01-27 DIAGNOSIS — F411 Generalized anxiety disorder: Secondary | ICD-10-CM | POA: Diagnosis not present

## 2021-01-27 DIAGNOSIS — F322 Major depressive disorder, single episode, severe without psychotic features: Secondary | ICD-10-CM | POA: Diagnosis not present

## 2021-01-27 DIAGNOSIS — F902 Attention-deficit hyperactivity disorder, combined type: Secondary | ICD-10-CM | POA: Diagnosis not present

## 2021-01-28 DIAGNOSIS — F411 Generalized anxiety disorder: Secondary | ICD-10-CM | POA: Diagnosis not present

## 2021-01-28 DIAGNOSIS — F322 Major depressive disorder, single episode, severe without psychotic features: Secondary | ICD-10-CM | POA: Diagnosis not present

## 2021-01-28 DIAGNOSIS — F902 Attention-deficit hyperactivity disorder, combined type: Secondary | ICD-10-CM | POA: Diagnosis not present

## 2021-01-29 DIAGNOSIS — Z6826 Body mass index (BMI) 26.0-26.9, adult: Secondary | ICD-10-CM | POA: Diagnosis not present

## 2021-01-29 DIAGNOSIS — S40861A Insect bite (nonvenomous) of right upper arm, initial encounter: Secondary | ICD-10-CM | POA: Diagnosis not present

## 2021-01-29 DIAGNOSIS — W57XXXA Bitten or stung by nonvenomous insect and other nonvenomous arthropods, initial encounter: Secondary | ICD-10-CM | POA: Diagnosis not present

## 2021-02-05 DIAGNOSIS — F902 Attention-deficit hyperactivity disorder, combined type: Secondary | ICD-10-CM | POA: Diagnosis not present

## 2021-02-05 DIAGNOSIS — F411 Generalized anxiety disorder: Secondary | ICD-10-CM | POA: Diagnosis not present

## 2021-02-05 DIAGNOSIS — F322 Major depressive disorder, single episode, severe without psychotic features: Secondary | ICD-10-CM | POA: Diagnosis not present

## 2021-02-24 DIAGNOSIS — F322 Major depressive disorder, single episode, severe without psychotic features: Secondary | ICD-10-CM | POA: Diagnosis not present

## 2021-02-24 DIAGNOSIS — F902 Attention-deficit hyperactivity disorder, combined type: Secondary | ICD-10-CM | POA: Diagnosis not present

## 2021-02-24 DIAGNOSIS — F411 Generalized anxiety disorder: Secondary | ICD-10-CM | POA: Diagnosis not present

## 2021-03-25 DIAGNOSIS — F902 Attention-deficit hyperactivity disorder, combined type: Secondary | ICD-10-CM | POA: Diagnosis not present

## 2021-03-25 DIAGNOSIS — F322 Major depressive disorder, single episode, severe without psychotic features: Secondary | ICD-10-CM | POA: Diagnosis not present

## 2021-03-25 DIAGNOSIS — F411 Generalized anxiety disorder: Secondary | ICD-10-CM | POA: Diagnosis not present

## 2021-04-01 ENCOUNTER — Other Ambulatory Visit: Payer: Self-pay

## 2021-04-01 ENCOUNTER — Ambulatory Visit
Admission: EM | Admit: 2021-04-01 | Discharge: 2021-04-01 | Disposition: A | Discharge status: Home / Self Care | Payer: BC Managed Care – PPO | Attending: Emergency Medicine | Admitting: Emergency Medicine

## 2021-04-01 DIAGNOSIS — R5383 Other fatigue: Secondary | ICD-10-CM | POA: Diagnosis not present

## 2021-04-01 DIAGNOSIS — J029 Acute pharyngitis, unspecified: Secondary | ICD-10-CM | POA: Diagnosis not present

## 2021-04-01 DIAGNOSIS — Z20822 Contact with and (suspected) exposure to covid-19: Secondary | ICD-10-CM | POA: Diagnosis not present

## 2021-04-01 DIAGNOSIS — Z87891 Personal history of nicotine dependence: Secondary | ICD-10-CM | POA: Insufficient documentation

## 2021-04-01 DIAGNOSIS — T50Z95A Adverse effect of other vaccines and biological substances, initial encounter: Secondary | ICD-10-CM

## 2021-04-01 DIAGNOSIS — R059 Cough, unspecified: Secondary | ICD-10-CM | POA: Diagnosis not present

## 2021-04-01 DIAGNOSIS — R0981 Nasal congestion: Secondary | ICD-10-CM | POA: Insufficient documentation

## 2021-04-01 DIAGNOSIS — T50B95A Adverse effect of other viral vaccines, initial encounter: Secondary | ICD-10-CM | POA: Insufficient documentation

## 2021-04-01 LAB — RESP PANEL BY RT-PCR (FLU A&B, COVID) ARPGX2
Influenza A by PCR: NEGATIVE
Influenza B by PCR: NEGATIVE
SARS Coronavirus 2 by RT PCR: NEGATIVE

## 2021-04-01 MED ORDER — PROMETHAZINE-DM 6.25-15 MG/5ML PO SYRP: 5.0000 mL | ORAL_SOLUTION | Freq: Four times a day (QID) | ORAL | 0 refills | Status: DC | PRN

## 2021-04-01 MED ORDER — IPRATROPIUM BROMIDE 0.06 % NA SOLN: 2.0000 | Freq: Four times a day (QID) | NASAL | 12 refills | Status: AC

## 2021-04-01 MED ORDER — BENZONATATE 100 MG PO CAPS: 200.0000 mg | ORAL_CAPSULE | Freq: Three times a day (TID) | ORAL | 0 refills | Status: DC

## 2021-04-01 NOTE — Discharge Instructions (Addendum)
Your testing today did not reveal the presence of influenza or COVID.  I think the response you are experiencing is a normal immune response to receiving both vaccines concurrently.  Use the Atrovent nasal spray, 2 squirts in each nostril every 6 hours, as needed for runny nose and postnasal drip.  Use the Tessalon Perles every 8 hours during the day.  Take them with a small sip of water.  They may give you some numbness to the base of your tongue or a metallic taste in your mouth, this is normal.  Use the Promethazine DM cough syrup at bedtime for cough and congestion.  It will make you drowsy so do not take it during the day.  Use over-the-counter Tylenol and ibuprofen according to package instructions as needed for fever and body aches.  Return for reevaluation or see your primary care provider for any new or worsening symptoms.

## 2021-04-01 NOTE — ED Triage Notes (Signed)
Pt c/o receiving the Omicron booster and a flu shot this past Friday. Pt states she has had congestion, cough, fatigue, muscle aches and temp to 99.2 since Saturday. Pt also reports soreness to both arms at the injection sites. Pt had an e-visit and was recommended to be tested for flu and COVID.

## 2021-04-01 NOTE — ED Provider Notes (Signed)
[facility]    CSN: [id number] Arrival date & time: 04/01/21  1254      History   Chief Complaint Chief Complaint  Patient presents with   Cough   Nasal Congestion    HPI Kendra Martin is a 40 y.o. female.   HPI  40 year old female here for evaluation of respiratory complaints.  Patient reports that she received her omicron booster and her flu shot 4 days ago.  The following day she developed nasal congestion with a yellow-green nasal discharge, sore throat, fatigue, body aches, low-grade temp of 99.2, and a productive cough for a yellow-green mucus.  She states she has some bruising at the injection site on her right arm but she denies any redness at either of her injection sites.  She denies shortness of breath or wheezing or GI complaints.  Patient called her PCP who advised her to come get tested for COVID and flu.  History reviewed. No pertinent past medical history.  There are no problems to display for this patient.   History reviewed. No pertinent surgical history.  OB History     Gravida  0   Para  0   Term  0   Preterm  0   AB  0   Living  0      SAB  0   IAB  0   Ectopic  0   Multiple  0   Live Births               Home Medications    Prior to Admission medications   Medication Sig Start Date End Date Taking? Authorizing Provider  amphetamine-dextroamphetamine (ADDERALL XR) 25 MG 24 hr capsule Take 1 capsule by mouth every morning. 10/27/20  Yes [provider]  benzonatate (TESSALON) 100 MG capsule Take 2 capsules (200 mg total) by mouth every 8 (eight) hours. 04/01/21  Yes Becky Augusta, NP  ibuprofen (ADVIL,MOTRIN) 800 MG tablet Take 1 tablet (800 mg total) by mouth every 8 (eight) hours as needed. 03/27/15  Yes Beers, Charmayne Sheer, PA-C  ipratropium (ATROVENT) 0.06 % nasal spray Place 2 sprays into both nostrils 4 (four) times daily. 04/01/21  Yes Becky Augusta, NP  NUVARING 0.12-0.015 MG/24HR vaginal ring  08/30/14  Yes  [provider]  promethazine-dextromethorphan (PROMETHAZINE-DM) 6.25-15 MG/5ML syrup Take 5 mLs by mouth 4 (four) times daily as needed. 04/01/21  Yes Becky Augusta, NP  sertraline (ZOLOFT) 100 MG tablet Take 100 mg by mouth daily. 01/27/21  Yes [provider]    Family History Family History  Problem Relation Age of Onset   Cancer Mother    Hypertension Mother    Hyperlipidemia Father    Alcohol abuse Maternal Grandfather    Diabetes Paternal Grandmother    Heart disease Paternal Grandmother    Heart attack Paternal Grandmother    Alcohol abuse Paternal Grandfather    Breast cancer Neg Hx     Social History Social History   Tobacco Use   Smoking status: Former   Smokeless tobacco: Never  Building services engineer Use: Never used  Substance Use Topics   Alcohol use: Yes    Alcohol/week: 0.0 standard drinks    Comment: social   Drug use: No     Allergies   Copper, Isopropyl myristate, and Other   Review of Systems Review of Systems  Constitutional:  Positive for fatigue and fever. Negative for activity change and appetite change.  HENT:  Positive for congestion, rhinorrhea  and sore throat. Negative for ear pain.   Respiratory:  Positive for cough. Negative for shortness of breath and wheezing.   Gastrointestinal:  Negative for diarrhea, nausea and vomiting.  Musculoskeletal:  Positive for arthralgias and myalgias.  Skin:  Positive for color change. Negative for rash.  Hematological:  Positive for adenopathy.       Bilateral axillary lymphadenopathy  Psychiatric/Behavioral: Negative.      Physical Exam Triage Vital Signs ED Triage Vitals  Enc Vitals Group     BP 04/01/21 1348 (!) 147/91     Pulse Rate 04/01/21 1348 67     Resp 04/01/21 1348 18     Temp 04/01/21 1348 99.4 F (37.4 C)     Temp Source 04/01/21 1348 Oral     SpO2 04/01/21 1348 99 %     Weight 04/01/21 1345 150 lb (68 kg)     Height 04/01/21 1345 5\' 4"  (1.626 m)     Head  Circumference --      Peak Flow --      Pain Score 04/01/21 1344 2     Pain Loc --      Pain Edu? --      Excl. in GC? --    No data found.  Updated Vital Signs BP (!) 147/91 (BP Location: Left Arm)   Pulse 67   Temp 99.4 F (37.4 C) (Oral)   Resp 18   Ht 5\' 4"  (1.626 m)   Wt 150 lb (68 kg)   SpO2 99%   BMI 25.75 kg/m   Visual Acuity Right Eye Distance:   Left Eye Distance:   Bilateral Distance:    Right Eye Near:   Left Eye Near:    Bilateral Near:     Physical Exam Vitals and nursing note reviewed.  Constitutional:      General: She is not in acute distress.    Appearance: Normal appearance. She is not ill-appearing.  HENT:     Head: Normocephalic and atraumatic.     Right Ear: Tympanic membrane, ear canal and external ear normal. There is no impacted cerumen.     Left Ear: Tympanic membrane, ear canal and external ear normal. There is no impacted cerumen.     Nose: Congestion and rhinorrhea present.     Mouth/Throat:     Mouth: Mucous membranes are moist.     Pharynx: Oropharynx is clear. Posterior oropharyngeal erythema present.  Cardiovascular:     Rate and Rhythm: Normal rate and regular rhythm.     Pulses: Normal pulses.     Heart sounds: Normal heart sounds. No murmur heard.   No gallop.  Pulmonary:     Effort: Pulmonary effort is normal.     Breath sounds: Normal breath sounds. No wheezing, rhonchi or rales.  Musculoskeletal:     Cervical back: Normal range of motion and neck supple.  Lymphadenopathy:     Cervical: Cervical adenopathy present.  Skin:    General: Skin is warm and dry.     Capillary Refill: Capillary refill takes less than 2 seconds.     Findings: No erythema or rash.  Neurological:     General: No focal deficit present.     Mental Status: She is alert and oriented to person, place, and time.  Psychiatric:        Mood and Affect: Mood normal.        Behavior: Behavior normal.        Thought Content: Thought content normal.  Judgment: Judgment normal.     UC Treatments / Results  Labs (all labs ordered are listed, but only abnormal results are displayed) Labs Reviewed  RESP PANEL BY RT-PCR (FLU A&B, COVID) [id number]    EKG   Radiology No results found.  Procedures Procedures (including critical care time)  Medications Ordered in UC Medications - No data to display  Initial Impression / Assessment and Plan / UC Course  I have reviewed the triage vital signs and the nursing notes.  Pertinent labs & imaging results that were available during my care of the patient were reviewed by me and considered in my medical decision making (see chart for details).  Patient is a nontoxic-appearing 40 year old female here for evaluation of upper respiratory symptoms and a productive cough that started 1 day after she received her combined COVID omicron vaccine and flu shot.  She was advised to come in and be tested for both by her primary care provider.  Her symptoms are outlined in the HPI above.  Patient's physical exam reveals pearly gray tympanic membranes bilaterally with a normal light reflex and clear external auditory canals.  Nasal mucosa is erythematous and mildly edematous with clear nasal discharge in both nares.  Oropharyngeal exam reveals posterior oropharyngeal erythema and cobblestoning with clear postnasal drip.  Patient does have bilateral anterior cervical and axillary lymphadenopathy.  Cardiopulmonary exam reveals clear lung sounds in all fields.  Respiratory triplex panel was collected at triage and is pending.  Respiratory triplex panel is negative for COVID and influenza.  Will discharge patient home with a diagnosis of normal immune response to vaccination.  We will give Atrovent nasal spray, Tessalon Perles, and Promethazine DM cough syrup to help with symptoms management.   Final Clinical Impressions(s) / UC Diagnoses   Final diagnoses:  Vaccine reaction, initial encounter     Discharge  Instructions      Your testing today did not reveal the presence of influenza or COVID.  I think the response you are experiencing is a normal immune response to receiving both vaccines concurrently.  Use the Atrovent nasal spray, 2 squirts in each nostril every 6 hours, as needed for runny nose and postnasal drip.  Use the Tessalon Perles every 8 hours during the day.  Take them with a small sip of water.  They may give you some numbness to the base of your tongue or a metallic taste in your mouth, this is normal.  Use the Promethazine DM cough syrup at bedtime for cough and congestion.  It will make you drowsy so do not take it during the day.  Use over-the-counter Tylenol and ibuprofen according to package instructions as needed for fever and body aches.  Return for reevaluation or see your primary care provider for any new or worsening symptoms.      ED Prescriptions     Medication Sig Dispense Auth. Provider   benzonatate (TESSALON) 100 MG capsule Take 2 capsules (200 mg total) by mouth every 8 (eight) hours. 21 capsule Becky Augusta, NP   ipratropium (ATROVENT) 0.06 % nasal spray Place 2 sprays into both nostrils 4 (four) times daily. 15 mL Becky Augusta, NP   promethazine-dextromethorphan (PROMETHAZINE-DM) 6.25-15 MG/5ML syrup Take 5 mLs by mouth 4 (four) times daily as needed. 118 mL Becky Augusta, NP      PDMP not reviewed this encounter.   Becky Augusta, NP 04/01/21 1455

## 2021-05-08 DIAGNOSIS — F902 Attention-deficit hyperactivity disorder, combined type: Secondary | ICD-10-CM | POA: Diagnosis not present

## 2021-05-08 DIAGNOSIS — F411 Generalized anxiety disorder: Secondary | ICD-10-CM | POA: Diagnosis not present

## 2021-05-08 DIAGNOSIS — F322 Major depressive disorder, single episode, severe without psychotic features: Secondary | ICD-10-CM | POA: Diagnosis not present

## 2021-06-17 DIAGNOSIS — F411 Generalized anxiety disorder: Secondary | ICD-10-CM | POA: Diagnosis not present

## 2021-07-08 DIAGNOSIS — F411 Generalized anxiety disorder: Secondary | ICD-10-CM | POA: Diagnosis not present

## 2021-07-15 DIAGNOSIS — F411 Generalized anxiety disorder: Secondary | ICD-10-CM | POA: Diagnosis not present

## 2021-07-23 DIAGNOSIS — Z0000 Encounter for general adult medical examination without abnormal findings: Secondary | ICD-10-CM | POA: Diagnosis not present

## 2021-07-23 DIAGNOSIS — Z8669 Personal history of other diseases of the nervous system and sense organs: Secondary | ICD-10-CM | POA: Diagnosis not present

## 2021-07-23 DIAGNOSIS — R0683 Snoring: Secondary | ICD-10-CM | POA: Diagnosis not present

## 2021-07-23 DIAGNOSIS — F419 Anxiety disorder, unspecified: Secondary | ICD-10-CM | POA: Diagnosis not present

## 2021-07-24 DIAGNOSIS — F902 Attention-deficit hyperactivity disorder, combined type: Secondary | ICD-10-CM | POA: Diagnosis not present

## 2021-07-24 DIAGNOSIS — F411 Generalized anxiety disorder: Secondary | ICD-10-CM | POA: Diagnosis not present

## 2021-07-24 DIAGNOSIS — F322 Major depressive disorder, single episode, severe without psychotic features: Secondary | ICD-10-CM | POA: Diagnosis not present

## 2021-07-29 DIAGNOSIS — F411 Generalized anxiety disorder: Secondary | ICD-10-CM | POA: Diagnosis not present

## 2021-08-05 DIAGNOSIS — F411 Generalized anxiety disorder: Secondary | ICD-10-CM | POA: Diagnosis not present

## 2021-08-07 ENCOUNTER — Ambulatory Visit (INDEPENDENT_AMBULATORY_CARE_PROVIDER_SITE_OTHER): Payer: BC Managed Care – PPO

## 2021-08-07 ENCOUNTER — Other Ambulatory Visit: Payer: Self-pay

## 2021-08-07 ENCOUNTER — Ambulatory Visit
Admission: EM | Admit: 2021-08-07 | Discharge: 2021-08-07 | Disposition: A | Discharge status: Home / Self Care | Payer: BC Managed Care – PPO | Attending: Emergency Medicine | Admitting: Emergency Medicine

## 2021-08-07 DIAGNOSIS — S63601A Unspecified sprain of right thumb, initial encounter: Secondary | ICD-10-CM

## 2021-08-07 NOTE — ED Provider Notes (Signed)
[facility]    CSN: [id number] Arrival date & time: 08/07/21  0817      History   Chief Complaint Chief Complaint  Patient presents with   Finger Injury    HPI Kendra Martin is a 41 y.o. female.   Patient presents with limited range of motion, pain and swelling of the right thumb for 1 day.  Endorses she was carrying a bag of cat food when it slipped and thumb hyperextended.  Pain is extending from the thumb down into the radial aspect of the wrist.  Unable to grip objects.  Denies numbness, tingling, prior injury or trauma.  Has attempted use of ice and Advil which has been somewhat effective.  History reviewed. No pertinent past medical history.  There are no problems to display for this patient.   History reviewed. No pertinent surgical history.  OB History     Gravida  0   Para  0   Term  0   Preterm  0   AB  0   Living  0      SAB  0   IAB  0   Ectopic  0   Multiple  0   Live Births               Home Medications    Prior to Admission medications   Medication Sig Start Date End Date Taking? Authorizing Provider  amphetamine-dextroamphetamine (ADDERALL XR) 25 MG 24 hr capsule Take 1 capsule by mouth every morning. 10/27/20  Yes [provider]  ibuprofen (ADVIL,MOTRIN) 800 MG tablet Take 1 tablet (800 mg total) by mouth every 8 (eight) hours as needed. 03/27/15  Yes Beers, Pierce Crane, PA-C  ipratropium (ATROVENT) 0.06 % nasal spray Place 2 sprays into both nostrils 4 (four) times daily. 04/01/21  Yes Margarette Canada, NP  NUVARING 0.12-0.015 MG/24HR vaginal ring  08/30/14  Yes [provider]  sertraline (ZOLOFT) 100 MG tablet Take 100 mg by mouth daily. 01/27/21  Yes [provider]  benzonatate (TESSALON) 100 MG capsule Take 2 capsules (200 mg total) by mouth every 8 (eight) hours. 04/01/21   Margarette Canada, NP  promethazine-dextromethorphan (PROMETHAZINE-DM) 6.25-15 MG/5ML syrup Take 5 mLs by mouth 4 (four) times daily as  needed. 04/01/21   Margarette Canada, NP    Family History Family History  Problem Relation Age of Onset   Cancer Mother    Hypertension Mother    Hyperlipidemia Father    Alcohol abuse Maternal Grandfather    Diabetes Paternal Grandmother    Heart disease Paternal Grandmother    Heart attack Paternal Grandmother    Alcohol abuse Paternal Grandfather    Breast cancer Neg Hx     Social History Social History   Tobacco Use   Smoking status: Former   Smokeless tobacco: Never  Scientific laboratory technician Use: Never used  Substance Use Topics   Alcohol use: Yes    Alcohol/week: 0.0 standard drinks    Comment: social   Drug use: No     Allergies   Copper, Grass extracts [gramineae pollens], Isopropyl myristate, and Other   Review of Systems Review of Systems  Constitutional: Negative.   Respiratory: Negative.    Cardiovascular: Negative.   Musculoskeletal:  Positive for joint swelling. Negative for arthralgias, back pain, gait problem, myalgias, neck pain and neck stiffness.  Skin: Negative.   Neurological: Negative.     Physical Exam Triage Vital Signs ED Triage Vitals  Enc Vitals Group  BP 08/07/21 0833 (!) 131/105     Pulse Rate 08/07/21 0833 (!) 106     Resp 08/07/21 0833 18     Temp 08/07/21 0833 99.3 F (37.4 C)     Temp Source 08/07/21 0833 Oral     SpO2 08/07/21 0833 100 %     Weight 08/07/21 0830 157 lb (71.2 kg)     Height 08/07/21 0830 5\' 4"  (1.626 m)     Head Circumference --      Peak Flow --      Pain Score 08/07/21 0829 10     Pain Loc --      Pain Edu? --      Excl. in GC? --    No data found.  Updated Vital Signs BP (!) 131/105 (BP Location: Left Arm)    Pulse (!) 106    Temp 99.3 F (37.4 C) (Oral)    Resp 18    Ht 5\' 4"  (1.626 m)    Wt 157 lb (71.2 kg)    LMP 07/10/2021    SpO2 100%    BMI 26.95 kg/m   Visual Acuity Right Eye Distance:   Left Eye Distance:   Bilateral Distance:    Right Eye Near:   Left Eye Near:    Bilateral Near:      Physical Exam Constitutional:      Appearance: Normal appearance.  HENT:     Head: Normocephalic.  Eyes:     Extraocular Movements: Extraocular movements intact.  Pulmonary:     Effort: Pulmonary effort is normal.  Musculoskeletal:     Comments: Mild swelling and point tenderness around the base of the first metacarpal, tenderness along the lateral aspect at the middle phalanx of the great thumb, sensation intact, capillary refill less than 3, 2+ radial pulse, limited range of motion, unable to fully flex or extend finger  Skin:    General: Skin is warm and dry.  Neurological:     Mental Status: She is alert and oriented to person, place, and time. Mental status is at baseline.  Psychiatric:        Mood and Affect: Mood normal.        Behavior: Behavior normal.     UC Treatments / Results  Labs (all labs ordered are listed, but only abnormal results are displayed) Labs Reviewed - No data to display  EKG   Radiology No results found.  Procedures Procedures (including critical care time)  Medications Ordered in UC Medications - No data to display  Initial Impression / Assessment and Plan / UC Course  I have reviewed the triage vital signs and the nursing notes.  Pertinent labs & imaging results that were available during my care of the patient were reviewed by me and considered in my medical decision making (see chart for details).  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   Hans Eden, [place] 08/07/21 [phone]

## 2021-08-07 NOTE — Discharge Instructions (Signed)
Your x-ray today did not show injury to the bone.  Your pain is most likely being caused by irritation to the soft tissues, this should improve as time progresses.   Take 2 naproxen tablets every morning and every evening for the next 5 days then as needed  You may apply heat or ice, whichever makes you feel better, to affected area in 15 minute intervals  You may continue activity as tolerated, there is no injury therefore, it is important that you continue to move around so you do not loose strength to the area  You may continue use of splint for support as needed  If symptoms persist past 2 weeks, you may follow up at urgent care or with orthopedic specialist for evaluation, an orthopedic doctor specializes in the bone, they may provide  management such as but not limited to imaging, long term medications and physical therapy

## 2021-08-07 NOTE — ED Triage Notes (Signed)
Pt c/o right thumb injury. Pt was carrying a 42 pound bag of cat litter and it slipped out of her hand and pulled her thumb backwards. Pt states that it is not tender to touch but it can not be fully bent. Pt states that she can not hold onto a toothbrush. Pt states that it is only tender inside the groove between thumb and palm.   Pt states that the pain is extending down her palm to her wrist on her thumb side.  Pt last took Advil 12 hour around 10pm last night.

## 2021-08-08 DIAGNOSIS — F419 Anxiety disorder, unspecified: Secondary | ICD-10-CM | POA: Diagnosis not present

## 2021-08-08 DIAGNOSIS — Z131 Encounter for screening for diabetes mellitus: Secondary | ICD-10-CM | POA: Diagnosis not present

## 2021-08-08 DIAGNOSIS — Z13220 Encounter for screening for lipoid disorders: Secondary | ICD-10-CM | POA: Diagnosis not present

## 2021-08-08 DIAGNOSIS — Z130 Encounter for screening for diseases of the blood and blood-forming organs and certain disorders involving the immune mechanism: Secondary | ICD-10-CM | POA: Diagnosis not present

## 2021-08-08 DIAGNOSIS — Z0000 Encounter for general adult medical examination without abnormal findings: Secondary | ICD-10-CM | POA: Diagnosis not present

## 2021-08-09 DIAGNOSIS — F411 Generalized anxiety disorder: Secondary | ICD-10-CM | POA: Diagnosis not present

## 2021-08-12 DIAGNOSIS — F411 Generalized anxiety disorder: Secondary | ICD-10-CM | POA: Diagnosis not present

## 2021-08-19 DIAGNOSIS — F411 Generalized anxiety disorder: Secondary | ICD-10-CM | POA: Diagnosis not present

## 2021-09-02 DIAGNOSIS — F411 Generalized anxiety disorder: Secondary | ICD-10-CM | POA: Diagnosis not present

## 2021-09-06 DIAGNOSIS — F411 Generalized anxiety disorder: Secondary | ICD-10-CM | POA: Diagnosis not present

## 2021-09-09 DIAGNOSIS — F411 Generalized anxiety disorder: Secondary | ICD-10-CM | POA: Diagnosis not present

## 2021-09-16 DIAGNOSIS — F411 Generalized anxiety disorder: Secondary | ICD-10-CM | POA: Diagnosis not present

## 2021-09-19 DIAGNOSIS — Z8669 Personal history of other diseases of the nervous system and sense organs: Secondary | ICD-10-CM | POA: Diagnosis not present

## 2021-09-19 DIAGNOSIS — G4733 Obstructive sleep apnea (adult) (pediatric): Secondary | ICD-10-CM | POA: Diagnosis not present

## 2021-09-19 DIAGNOSIS — R0683 Snoring: Secondary | ICD-10-CM | POA: Diagnosis not present

## 2021-09-24 DIAGNOSIS — F902 Attention-deficit hyperactivity disorder, combined type: Secondary | ICD-10-CM | POA: Diagnosis not present

## 2021-09-24 DIAGNOSIS — F411 Generalized anxiety disorder: Secondary | ICD-10-CM | POA: Diagnosis not present

## 2021-09-24 DIAGNOSIS — F322 Major depressive disorder, single episode, severe without psychotic features: Secondary | ICD-10-CM | POA: Diagnosis not present

## 2021-09-30 DIAGNOSIS — F411 Generalized anxiety disorder: Secondary | ICD-10-CM | POA: Diagnosis not present

## 2021-10-01 DIAGNOSIS — Z1231 Encounter for screening mammogram for malignant neoplasm of breast: Secondary | ICD-10-CM | POA: Diagnosis not present

## 2021-10-07 DIAGNOSIS — F411 Generalized anxiety disorder: Secondary | ICD-10-CM | POA: Diagnosis not present

## 2021-10-11 DIAGNOSIS — F411 Generalized anxiety disorder: Secondary | ICD-10-CM | POA: Diagnosis not present

## 2021-10-14 DIAGNOSIS — F411 Generalized anxiety disorder: Secondary | ICD-10-CM | POA: Diagnosis not present

## 2021-10-16 DIAGNOSIS — F322 Major depressive disorder, single episode, severe without psychotic features: Secondary | ICD-10-CM | POA: Diagnosis not present

## 2021-10-16 DIAGNOSIS — F902 Attention-deficit hyperactivity disorder, combined type: Secondary | ICD-10-CM | POA: Diagnosis not present

## 2021-10-16 DIAGNOSIS — F411 Generalized anxiety disorder: Secondary | ICD-10-CM | POA: Diagnosis not present

## 2021-10-28 DIAGNOSIS — F411 Generalized anxiety disorder: Secondary | ICD-10-CM | POA: Diagnosis not present

## 2021-11-04 DIAGNOSIS — F411 Generalized anxiety disorder: Secondary | ICD-10-CM | POA: Diagnosis not present

## 2021-11-14 DIAGNOSIS — F411 Generalized anxiety disorder: Secondary | ICD-10-CM | POA: Diagnosis not present

## 2021-11-15 DIAGNOSIS — F411 Generalized anxiety disorder: Secondary | ICD-10-CM | POA: Diagnosis not present

## 2021-11-25 DIAGNOSIS — F411 Generalized anxiety disorder: Secondary | ICD-10-CM | POA: Diagnosis not present

## 2021-12-09 DIAGNOSIS — F411 Generalized anxiety disorder: Secondary | ICD-10-CM | POA: Diagnosis not present

## 2021-12-16 DIAGNOSIS — F411 Generalized anxiety disorder: Secondary | ICD-10-CM | POA: Diagnosis not present

## 2021-12-25 ENCOUNTER — Encounter: Payer: Self-pay | Admitting: Emergency Medicine

## 2021-12-25 ENCOUNTER — Other Ambulatory Visit: Payer: Self-pay

## 2021-12-25 ENCOUNTER — Ambulatory Visit
Admission: EM | Admit: 2021-12-25 | Discharge: 2021-12-25 | Disposition: A | Discharge status: Home / Self Care | Payer: BC Managed Care – PPO

## 2021-12-25 DIAGNOSIS — R197 Diarrhea, unspecified: Secondary | ICD-10-CM

## 2021-12-25 DIAGNOSIS — R110 Nausea: Secondary | ICD-10-CM | POA: Diagnosis not present

## 2021-12-25 MED ORDER — ONDANSETRON 4 MG PO TBDP: 4.0000 mg | ORAL_TABLET | Freq: Four times a day (QID) | ORAL | 0 refills | Status: AC | PRN

## 2021-12-25 NOTE — Discharge Instructions (Signed)
Nausea and Diarrhea: Likely stomach virus.  You may take Tylenol for pain relief. Use medications as directed including antiemetics and antidiarrheal medications if suggested or prescribed. You should increase fluids and electrolytes as well as rest over these next several days. If you have any questions or concerns, or if your symptoms are not improving or if especially if they acutely worsen, please call or stop back to the clinic immediately and we will be happy to help you or go to the ER   RED FLAGS: Seek immediate further care if: symptoms remain the same or worsen over the next 3-7 days, you are unable to keep fluids down, you see blood or mucus in your stool, you vomit black or dark red material, you have a fever of 101.F or higher, you have localized and/or persistent abdominal pain

## 2021-12-25 NOTE — ED Provider Notes (Signed)
[facility]    CSN: [id number] Arrival date & time: 12/25/21  0856      History   Chief Complaint Chief Complaint  Patient presents with   Diarrhea    HPI Kendra Martin is a 41 y.o. female presenting for 3.5-day history of nausea.  Patient reports that she had diarrhea on Monday and Tuesday but has not had any diarrhea in the past 24 to 48 hours.  She has not had any vomiting.  She denies any associated fevers and says her temps been about 99 degrees.  Reports reduced appetite and says she has been eating a bland diet which has improved her symptoms somewhat.  She has not taken any medicine over-the-counter for her symptoms.  She is denying any associated abdominal pain, dysuria, frequency or urgency.  Denying any URI symptoms.  Patient denies any sick contacts.  No reports of any recent antibiotic use or travel.  Patient is otherwise healthy.  No other complaints.  HPI  History reviewed. No pertinent past medical history.  There are no problems to display for this patient.   History reviewed. No pertinent surgical history.  OB History     Gravida  0   Para  0   Term  0   Preterm  0   AB  0   Living  0      SAB  0   IAB  0   Ectopic  0   Multiple  0   Live Births               Home Medications    Prior to Admission medications   Medication Sig Start Date End Date Taking? Authorizing Provider  NUVARING 0.12-0.015 MG/24HR vaginal ring  08/30/14  Yes [provider]  ondansetron (ZOFRAN-ODT) 4 MG disintegrating tablet Take 1 tablet (4 mg total) by mouth every 6 (six) hours as needed for up to 3 days for nausea or vomiting. 12/25/21 12/28/21 Yes Eusebio Friendly B, PA-C  sertraline (ZOLOFT) 100 MG tablet Take 100 mg by mouth daily. 01/27/21  Yes [provider]  VYVANSE 50 MG capsule Take 50 mg by mouth daily. 11/25/21  Yes [provider]  amphetamine-dextroamphetamine (ADDERALL XR) 25 MG 24 hr capsule Take 1 capsule by mouth  every morning. 10/27/20   [provider]  ibuprofen (ADVIL,MOTRIN) 800 MG tablet Take 1 tablet (800 mg total) by mouth every 8 (eight) hours as needed. 03/27/15   Beers, Charmayne Sheer, PA-C  ipratropium (ATROVENT) 0.06 % nasal spray Place 2 sprays into both nostrils 4 (four) times daily. 04/01/21   Becky Augusta, NP    Family History Family History  Problem Relation Age of Onset   Cancer Mother    Hypertension Mother    Hyperlipidemia Father    Alcohol abuse Maternal Grandfather    Diabetes Paternal Grandmother    Heart disease Paternal Grandmother    Heart attack Paternal Grandmother    Alcohol abuse Paternal Grandfather    Breast cancer Neg Hx     Social History Social History   Tobacco Use   Smoking status: Former   Smokeless tobacco: Never  Building services engineer Use: Never used  Substance Use Topics   Alcohol use: Yes    Alcohol/week: 0.0 standard drinks of alcohol    Comment: social   Drug use: No     Allergies   Copper, Grass extracts [gramineae pollens], Isopropyl myristate, and Other   Review of Systems Review of  Systems  Constitutional:  Negative for chills, diaphoresis, fatigue and fever.  HENT:  Negative for congestion, ear pain, rhinorrhea and sore throat.   Respiratory:  Negative for cough and shortness of breath.   Cardiovascular:  Negative for chest pain.  Gastrointestinal:  Positive for diarrhea and nausea. Negative for abdominal pain and vomiting.  Genitourinary:  Negative for dysuria and frequency.  Musculoskeletal:  Negative for arthralgias and myalgias.  Skin:  Negative for rash.  Neurological:  Negative for weakness and headaches.     Physical Exam Triage Vital Signs ED Triage Vitals  Enc Vitals Group     BP      Pulse      Resp      Temp      Temp src      SpO2      Weight      Height      Head Circumference      Peak Flow      Pain Score      Pain Loc      Pain Edu?      Excl. in GC?    No data found.  Updated Vital  Signs BP 135/89 (BP Location: Left Arm)   Pulse 91   Temp 98.9 F (37.2 C) (Oral)   Resp 16   Ht 5\' 4"  (1.626 m)   Wt 156 lb 15.5 oz (71.2 kg)   LMP 12/04/2021 (Approximate)   SpO2 97%   BMI 26.94 kg/m      Physical Exam Vitals and nursing note reviewed.  Constitutional:      General: She is not in acute distress.    Appearance: Normal appearance. She is not ill-appearing or toxic-appearing.  HENT:     Head: Normocephalic and atraumatic.     Nose: Nose normal.     Mouth/Throat:     Mouth: Mucous membranes are moist.     Pharynx: Oropharynx is clear.  Eyes:     General: No scleral icterus.       Right eye: No discharge.        Left eye: No discharge.     Conjunctiva/sclera: Conjunctivae normal.  Cardiovascular:     Rate and Rhythm: Normal rate and regular rhythm.     Heart sounds: Normal heart sounds.  Pulmonary:     Effort: Pulmonary effort is normal. No respiratory distress.     Breath sounds: Normal breath sounds.  Abdominal:     Palpations: Abdomen is soft.     Tenderness: There is no abdominal tenderness.  Musculoskeletal:     Cervical back: Neck supple.  Skin:    General: Skin is dry.  Neurological:     General: No focal deficit present.     Mental Status: She is alert. Mental status is at baseline.     Motor: No weakness.     Gait: Gait normal.  Psychiatric:        Mood and Affect: Mood normal.        Behavior: Behavior normal.        Thought Content: Thought content normal.      UC Treatments / Results  Labs (all labs ordered are listed, but only abnormal results are displayed) Labs Reviewed - No data to display  EKG   Radiology No results found.  Procedures Procedures (including critical care time)  Medications Ordered in UC Medications - No data to display  Initial Impression / Assessment and Plan / UC Course  I have reviewed  the triage vital signs and the nursing notes.  Pertinent labs & imaging results that were available during  my care of the patient were reviewed by me and considered in my medical decision making (see chart for details).  41 year old female presenting for nausea and diarrhea for the past couple of days.  She denies any fever or abdominal pain.  Symptoms have improved from onset since she has been eating a bland diet.  She says she wants to just make sure this is a plain stomach bug and not anything serious.  Vitals are normal and stable. She is overall well-appearing.  Moist mucous membranes.  Abdomen is soft and nontender.  Chest clear auscultation heart regular rate and rhythm.  Symptoms appear to be mild and improving.  Advised patient likely stomach virus.  Supportive care.  Sent Zofran to pharmacy and encouraged her to also pick up some Gas-X because she states she has been having a lot of gas.  Encouraged to continue bland diet and plenty of rest and fluids.  Reviewed returning or going to ER if she develops a fever, abdominal pain or worsening symptoms or if she is not continuing to improve after the next 2 to 3 days.  Final Clinical Impressions(s) / UC Diagnoses   Final diagnoses:  Diarrhea, unspecified type  Nausea     Discharge Instructions      Nausea and Diarrhea: Likely stomach virus.  You may take Tylenol for pain relief. Use medications as directed including antiemetics and antidiarrheal medications if suggested or prescribed. You should increase fluids and electrolytes as well as rest over these next several days. If you have any questions or concerns, or if your symptoms are not improving or if especially if they acutely worsen, please call or stop back to the clinic immediately and we will be happy to help you or go to the ER   RED FLAGS: Seek immediate further care if: symptoms remain the same or worsen over the next 3-7 days, you are unable to keep fluids down, you see blood or mucus in your stool, you vomit black or dark red material, you have a fever of 101.F or higher, you have  localized and/or persistent abdominal pain       ED Prescriptions     Medication Sig Dispense Auth. Provider   ondansetron (ZOFRAN-ODT) 4 MG disintegrating tablet Take 1 tablet (4 mg total) by mouth every 6 (six) hours as needed for up to 3 days for nausea or vomiting. 10 tablet Gareth Morgan      PDMP not reviewed this encounter.   Shirlee Latch, PA-C 12/25/21 [phone]

## 2021-12-25 NOTE — ED Triage Notes (Addendum)
Pt c/o nausea, diarrhea, headache, and "low grade fever"(99.0). Started about 3 days ago. She states the first day she swallowed her vomit. Denies abdominal pain. Pt states she has been eating a bland diet and symptoms have improved.

## 2022-01-06 DIAGNOSIS — F411 Generalized anxiety disorder: Secondary | ICD-10-CM | POA: Diagnosis not present

## 2022-01-13 DIAGNOSIS — F411 Generalized anxiety disorder: Secondary | ICD-10-CM | POA: Diagnosis not present

## 2022-01-15 DIAGNOSIS — F411 Generalized anxiety disorder: Secondary | ICD-10-CM | POA: Diagnosis not present

## 2022-01-15 DIAGNOSIS — F322 Major depressive disorder, single episode, severe without psychotic features: Secondary | ICD-10-CM | POA: Diagnosis not present

## 2022-01-15 DIAGNOSIS — F902 Attention-deficit hyperactivity disorder, combined type: Secondary | ICD-10-CM | POA: Diagnosis not present

## 2022-01-20 DIAGNOSIS — F411 Generalized anxiety disorder: Secondary | ICD-10-CM | POA: Diagnosis not present

## 2022-01-27 DIAGNOSIS — F411 Generalized anxiety disorder: Secondary | ICD-10-CM | POA: Diagnosis not present

## 2022-02-03 DIAGNOSIS — F411 Generalized anxiety disorder: Secondary | ICD-10-CM | POA: Diagnosis not present

## 2022-02-10 DIAGNOSIS — F411 Generalized anxiety disorder: Secondary | ICD-10-CM | POA: Diagnosis not present

## 2022-02-17 DIAGNOSIS — F411 Generalized anxiety disorder: Secondary | ICD-10-CM | POA: Diagnosis not present

## 2022-02-24 DIAGNOSIS — F411 Generalized anxiety disorder: Secondary | ICD-10-CM | POA: Diagnosis not present

## 2022-02-25 DIAGNOSIS — R0683 Snoring: Secondary | ICD-10-CM | POA: Diagnosis not present

## 2022-02-25 DIAGNOSIS — G4733 Obstructive sleep apnea (adult) (pediatric): Secondary | ICD-10-CM | POA: Diagnosis not present

## 2022-02-25 DIAGNOSIS — F419 Anxiety disorder, unspecified: Secondary | ICD-10-CM | POA: Diagnosis not present

## 2022-02-26 DIAGNOSIS — Z6826 Body mass index (BMI) 26.0-26.9, adult: Secondary | ICD-10-CM | POA: Diagnosis not present

## 2022-02-26 DIAGNOSIS — F900 Attention-deficit hyperactivity disorder, predominantly inattentive type: Secondary | ICD-10-CM | POA: Diagnosis not present

## 2022-02-26 DIAGNOSIS — Z3044 Encounter for surveillance of vaginal ring hormonal contraceptive device: Secondary | ICD-10-CM | POA: Diagnosis not present

## 2022-02-26 DIAGNOSIS — F411 Generalized anxiety disorder: Secondary | ICD-10-CM | POA: Diagnosis not present

## 2022-02-26 DIAGNOSIS — Z803 Family history of malignant neoplasm of breast: Secondary | ICD-10-CM | POA: Diagnosis not present

## 2022-02-26 DIAGNOSIS — Z01419 Encounter for gynecological examination (general) (routine) without abnormal findings: Secondary | ICD-10-CM | POA: Diagnosis not present

## 2022-02-26 DIAGNOSIS — G4730 Sleep apnea, unspecified: Secondary | ICD-10-CM | POA: Diagnosis not present

## 2022-03-10 DIAGNOSIS — F411 Generalized anxiety disorder: Secondary | ICD-10-CM | POA: Diagnosis not present

## 2022-03-10 IMAGING — CR DG FINGER THUMB 2+V*R*
3 series · 3 of 3 positions shown · non-contrast
Comparison: None.

CLINICAL DATA: Right PIP and metacarpophalangeal joint thumb
injury. Carrying 42 pound bag of catheter and subdeltoid hand and
pulled her thumb backwards. Difficulty with flexion.

EXAM:
RIGHT THUMB 2+V

[finger ap]
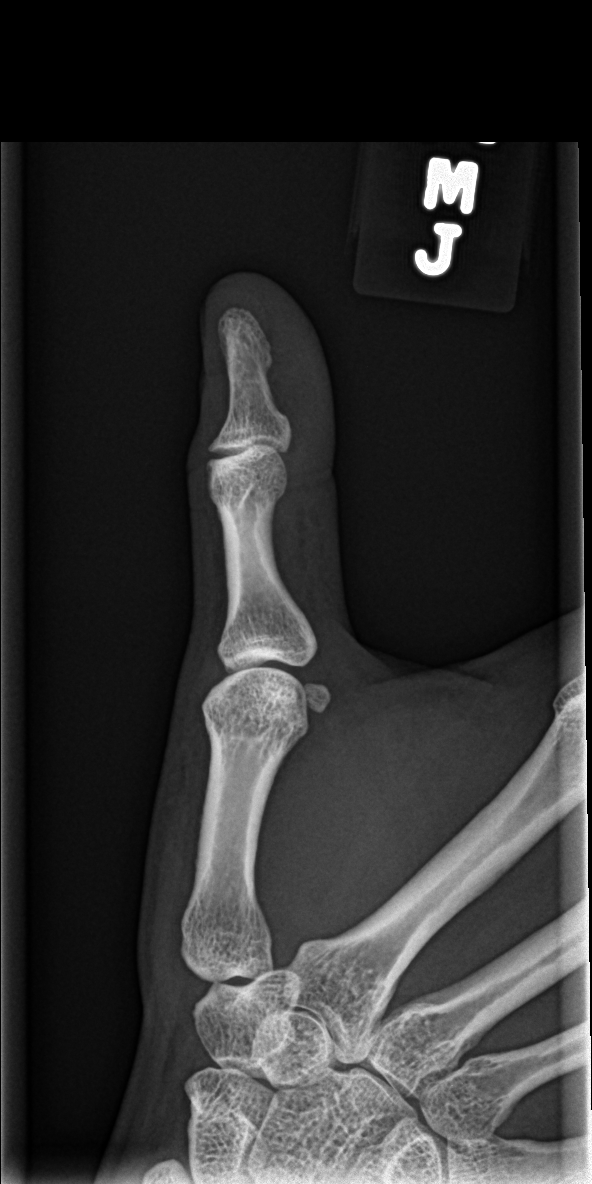

[finger obl]
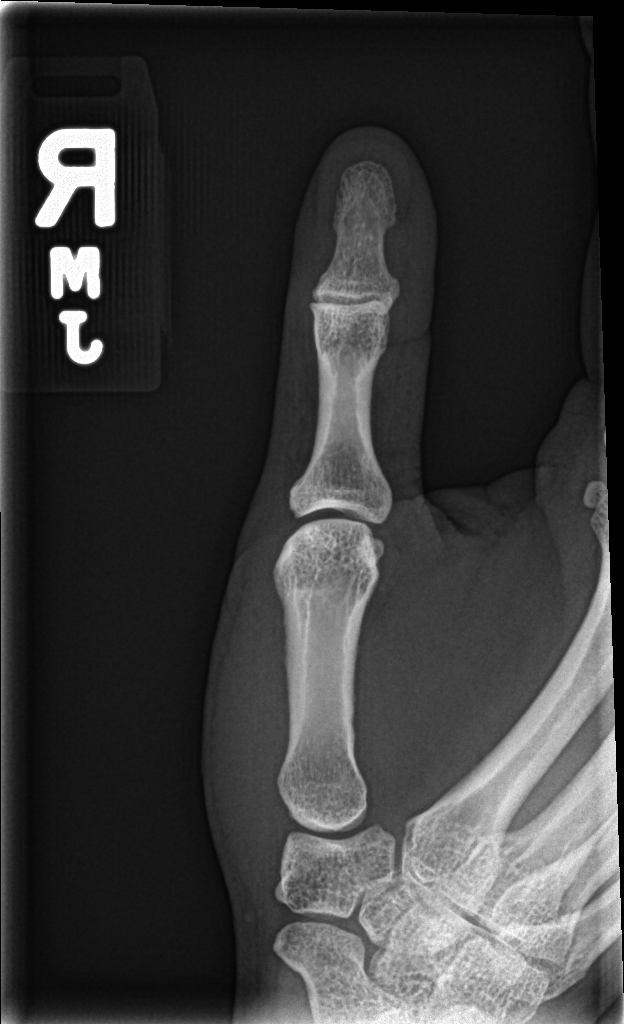

[finger lat]
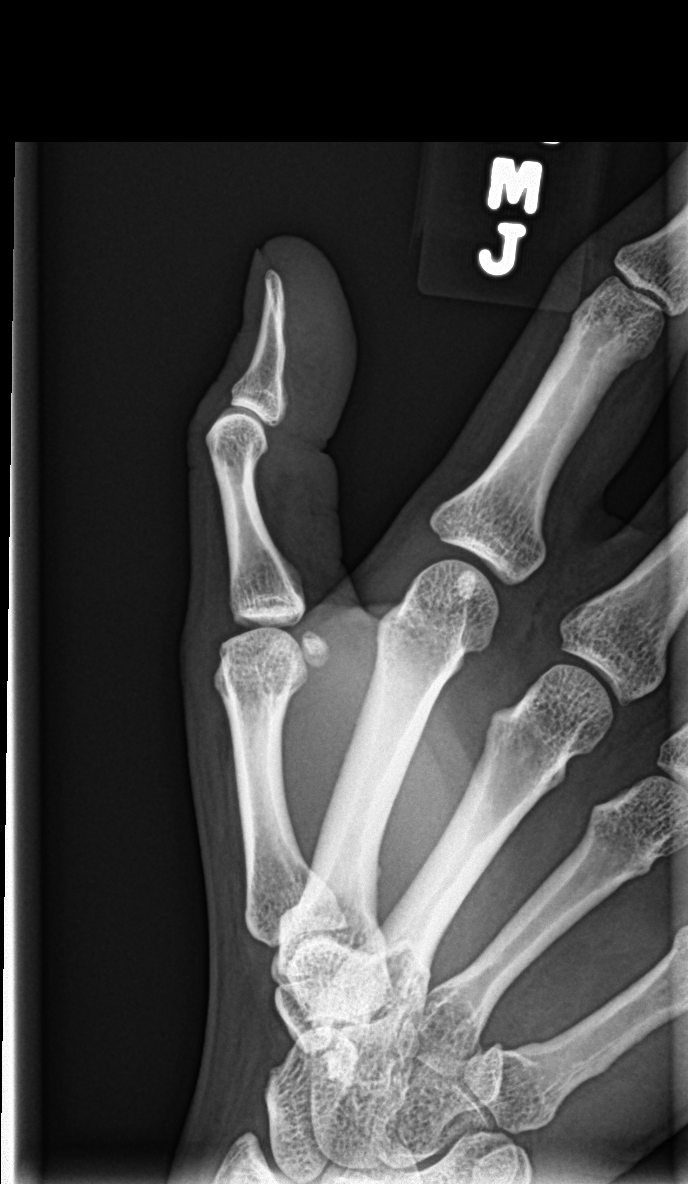

[3 of 3 positions shown; findings below may reference images not displayed]

FINDINGS: Normal bone mineralization. Minimal joint space narrowing and dorsal
degenerative spurring of the thumb interphalangeal joint. The thumb
metacarpophalangeal and carpometacarpal joint spaces are maintained.
No acute fracture or dislocation.
IMPRESSION: Mild thumb interphalangeal joint osteoarthritis.  No acute fracture.

## 2022-03-17 DIAGNOSIS — F411 Generalized anxiety disorder: Secondary | ICD-10-CM | POA: Diagnosis not present

## 2022-03-18 DIAGNOSIS — G4730 Sleep apnea, unspecified: Secondary | ICD-10-CM | POA: Diagnosis not present

## 2022-03-18 DIAGNOSIS — F900 Attention-deficit hyperactivity disorder, predominantly inattentive type: Secondary | ICD-10-CM | POA: Diagnosis not present

## 2022-03-18 DIAGNOSIS — F411 Generalized anxiety disorder: Secondary | ICD-10-CM | POA: Diagnosis not present

## 2022-03-24 DIAGNOSIS — F411 Generalized anxiety disorder: Secondary | ICD-10-CM | POA: Diagnosis not present

## 2022-04-03 DIAGNOSIS — G4733 Obstructive sleep apnea (adult) (pediatric): Secondary | ICD-10-CM | POA: Diagnosis not present

## 2022-04-06 DIAGNOSIS — G4730 Sleep apnea, unspecified: Secondary | ICD-10-CM | POA: Diagnosis not present

## 2022-04-06 DIAGNOSIS — F411 Generalized anxiety disorder: Secondary | ICD-10-CM | POA: Diagnosis not present

## 2022-04-06 DIAGNOSIS — F900 Attention-deficit hyperactivity disorder, predominantly inattentive type: Secondary | ICD-10-CM | POA: Diagnosis not present

## 2022-04-07 DIAGNOSIS — F411 Generalized anxiety disorder: Secondary | ICD-10-CM | POA: Diagnosis not present

## 2022-04-09 DIAGNOSIS — F902 Attention-deficit hyperactivity disorder, combined type: Secondary | ICD-10-CM | POA: Diagnosis not present

## 2022-04-09 DIAGNOSIS — F322 Major depressive disorder, single episode, severe without psychotic features: Secondary | ICD-10-CM | POA: Diagnosis not present

## 2022-04-09 DIAGNOSIS — F411 Generalized anxiety disorder: Secondary | ICD-10-CM | POA: Diagnosis not present

## 2022-04-13 DIAGNOSIS — F411 Generalized anxiety disorder: Secondary | ICD-10-CM | POA: Diagnosis not present

## 2022-04-13 DIAGNOSIS — F900 Attention-deficit hyperactivity disorder, predominantly inattentive type: Secondary | ICD-10-CM | POA: Diagnosis not present

## 2022-04-14 DIAGNOSIS — F411 Generalized anxiety disorder: Secondary | ICD-10-CM | POA: Diagnosis not present

## 2022-04-15 DIAGNOSIS — F900 Attention-deficit hyperactivity disorder, predominantly inattentive type: Secondary | ICD-10-CM | POA: Diagnosis not present

## 2022-04-15 DIAGNOSIS — F411 Generalized anxiety disorder: Secondary | ICD-10-CM | POA: Diagnosis not present

## 2022-04-21 DIAGNOSIS — F411 Generalized anxiety disorder: Secondary | ICD-10-CM | POA: Diagnosis not present

## 2022-04-28 DIAGNOSIS — F411 Generalized anxiety disorder: Secondary | ICD-10-CM | POA: Diagnosis not present

## 2022-04-29 DIAGNOSIS — F411 Generalized anxiety disorder: Secondary | ICD-10-CM | POA: Diagnosis not present

## 2022-04-29 DIAGNOSIS — F900 Attention-deficit hyperactivity disorder, predominantly inattentive type: Secondary | ICD-10-CM | POA: Diagnosis not present

## 2022-05-04 DIAGNOSIS — G4733 Obstructive sleep apnea (adult) (pediatric): Secondary | ICD-10-CM | POA: Diagnosis not present

## 2022-05-05 DIAGNOSIS — F411 Generalized anxiety disorder: Secondary | ICD-10-CM | POA: Diagnosis not present

## 2022-05-12 DIAGNOSIS — F411 Generalized anxiety disorder: Secondary | ICD-10-CM | POA: Diagnosis not present

## 2022-06-03 DIAGNOSIS — G4733 Obstructive sleep apnea (adult) (pediatric): Secondary | ICD-10-CM | POA: Diagnosis not present

## 2022-06-03 DIAGNOSIS — F900 Attention-deficit hyperactivity disorder, predominantly inattentive type: Secondary | ICD-10-CM | POA: Diagnosis not present

## 2022-06-03 DIAGNOSIS — F411 Generalized anxiety disorder: Secondary | ICD-10-CM | POA: Diagnosis not present

## 2022-06-10 DIAGNOSIS — Z23 Encounter for immunization: Secondary | ICD-10-CM | POA: Diagnosis not present

## 2022-06-10 DIAGNOSIS — G4733 Obstructive sleep apnea (adult) (pediatric): Secondary | ICD-10-CM | POA: Diagnosis not present

## 2022-06-15 DIAGNOSIS — F902 Attention-deficit hyperactivity disorder, combined type: Secondary | ICD-10-CM | POA: Diagnosis not present

## 2022-06-15 DIAGNOSIS — F411 Generalized anxiety disorder: Secondary | ICD-10-CM | POA: Diagnosis not present

## 2022-06-15 DIAGNOSIS — F322 Major depressive disorder, single episode, severe without psychotic features: Secondary | ICD-10-CM | POA: Diagnosis not present

## 2022-06-17 DIAGNOSIS — F411 Generalized anxiety disorder: Secondary | ICD-10-CM | POA: Diagnosis not present

## 2022-06-17 DIAGNOSIS — F900 Attention-deficit hyperactivity disorder, predominantly inattentive type: Secondary | ICD-10-CM | POA: Diagnosis not present

## 2022-07-09 ENCOUNTER — Ambulatory Visit
Admission: EM | Admit: 2022-07-09 | Discharge: 2022-07-09 | Disposition: A | Discharge status: Home / Self Care | Payer: BC Managed Care – PPO | Attending: Emergency Medicine | Admitting: Emergency Medicine

## 2022-07-09 DIAGNOSIS — F902 Attention-deficit hyperactivity disorder, combined type: Secondary | ICD-10-CM | POA: Diagnosis not present

## 2022-07-09 DIAGNOSIS — F322 Major depressive disorder, single episode, severe without psychotic features: Secondary | ICD-10-CM | POA: Diagnosis not present

## 2022-07-09 DIAGNOSIS — U071 COVID-19: Secondary | ICD-10-CM | POA: Insufficient documentation

## 2022-07-09 DIAGNOSIS — F411 Generalized anxiety disorder: Secondary | ICD-10-CM | POA: Diagnosis not present

## 2022-07-09 HISTORY — DX: Sleep apnea, unspecified: G47.30

## 2022-07-09 LAB — RESP PANEL BY RT-PCR (RSV, FLU A&B, COVID)  RVPGX2
Influenza A by PCR: NEGATIVE
Influenza B by PCR: NEGATIVE
Resp Syncytial Virus by PCR: NEGATIVE
SARS Coronavirus 2 by RT PCR: POSITIVE — AB

## 2022-07-09 MED ORDER — PROMETHAZINE-DM 6.25-15 MG/5ML PO SYRP: 5.0000 mL | ORAL_SOLUTION | Freq: Four times a day (QID) | ORAL | 0 refills | Status: AC | PRN

## 2022-07-09 MED ORDER — MOLNUPIRAVIR EUA 200MG CAPSULE: 4.0000 | ORAL_CAPSULE | Freq: Two times a day (BID) | ORAL | 0 refills | Status: AC

## 2022-07-09 MED ORDER — BENZONATATE 100 MG PO CAPS: 100.0000 mg | ORAL_CAPSULE | Freq: Three times a day (TID) | ORAL | 0 refills | Status: AC

## 2022-07-09 NOTE — ED Triage Notes (Signed)
Pt reports cough, low fever, headache, post nasal drip, body aches, and nausea that started today.   Pt denies being exposed to covid/flu.

## 2022-07-09 NOTE — ED Provider Notes (Signed)
[facility]    CSN: [id number] Arrival date & time: 07/09/22  1756      History   Chief Complaint No chief complaint on file.   HPI Kendra Martin is a 42 y.o. female.   Patient presents for evaluation of fever, sore throat, postnasal drip, cough, nausea and bodyaches beginning today upon awakening.  Known sick contacts but no specific illness.  Tolerating food and liquids.  Has not attempted treatment.  Denies shortness of breath, wheezing, abdominal pain, vomiting, diarrhea, ear pain.  No pertinent medical history.   Past Medical History:  Diagnosis Date   Sleep apnea     There are no problems to display for this patient.   History reviewed. No pertinent surgical history.  OB History     Gravida  0   Para  0   Term  0   Preterm  0   AB  0   Living  0      SAB  0   IAB  0   Ectopic  0   Multiple  0   Live Births               Home Medications    Prior to Admission medications   Medication Sig Start Date End Date Taking? Authorizing Provider  amphetamine-dextroamphetamine (ADDERALL XR) 25 MG 24 hr capsule Take 1 capsule by mouth every morning. 10/27/20   [provider]  ibuprofen (ADVIL,MOTRIN) 800 MG tablet Take 1 tablet (800 mg total) by mouth every 8 (eight) hours as needed. 03/27/15   Beers, Pierce Crane, PA-C  ipratropium (ATROVENT) 0.06 % nasal spray Place 2 sprays into both nostrils 4 (four) times daily. 04/01/21   Margarette Canada, NP  NUVARING 0.12-0.015 MG/24HR vaginal ring  08/30/14   [provider]  sertraline (ZOLOFT) 100 MG tablet Take 100 mg by mouth daily. 01/27/21   [provider]  VYVANSE 50 MG capsule Take 50 mg by mouth daily. 11/25/21   [provider]    Family History Family History  Problem Relation Age of Onset   Cancer Mother    Hypertension Mother    Hyperlipidemia Father    Alcohol abuse Maternal Grandfather    Diabetes Paternal Grandmother    Heart disease Paternal Grandmother     Heart attack Paternal Grandmother    Alcohol abuse Paternal Grandfather    Breast cancer Neg Hx     Social History Social History   Tobacco Use   Smoking status: Former   Smokeless tobacco: Never  Scientific laboratory technician Use: Never used  Substance Use Topics   Alcohol use: Yes    Alcohol/week: 0.0 standard drinks of alcohol    Comment: social   Drug use: No     Allergies   Copper, Grass extracts [gramineae pollens], Isopropyl myristate, and Other   Review of Systems Review of Systems  Constitutional:  Positive for fever. Negative for activity change, appetite change, chills, diaphoresis, fatigue and unexpected weight change.  HENT:  Positive for postnasal drip and sore throat. Negative for congestion, dental problem, drooling, ear discharge, ear pain, facial swelling, hearing loss, mouth sores, nosebleeds, rhinorrhea, sinus pressure, sinus pain, sneezing, tinnitus, trouble swallowing and voice change.   Respiratory:  Positive for cough. Negative for apnea, choking, chest tightness, shortness of breath, wheezing and stridor.   Cardiovascular: Negative.   Gastrointestinal:  Positive for nausea. Negative for abdominal distention, abdominal pain, anal bleeding, blood in stool, constipation, diarrhea, rectal pain and  vomiting.  Musculoskeletal:  Positive for myalgias. Negative for arthralgias, back pain, gait problem, joint swelling and neck pain.  Skin: Negative.   All other systems reviewed and are negative.    Physical Exam Triage Vital Signs ED Triage Vitals  Enc Vitals Group     BP 07/09/22 1830 (!) 134/91     Pulse Rate 07/09/22 1830 (!) 125     Resp 07/09/22 1830 20     Temp 07/09/22 1830 99.5 F (37.5 C)     Temp Source 07/09/22 1830 Oral     SpO2 07/09/22 1830 97 %     Weight --      Height --      Head Circumference --      Peak Flow --      Pain Score 07/09/22 1832 7     Pain Loc --      Pain Edu? --      Excl. in [place]? --    No data found.  Updated  Vital Signs BP (!) 134/91 (BP Location: Left Arm)   Pulse (!) 125   Temp 99.5 F (37.5 C) (Oral)   Resp 20   LMP 06/24/2022 (Exact Date)   SpO2 97%   Visual Acuity Right Eye Distance:   Left Eye Distance:   Bilateral Distance:    Right Eye Near:   Left Eye Near:    Bilateral Near:     Physical Exam Constitutional:      Appearance: Normal appearance.  HENT:     Head: Normocephalic.     Right Ear: Tympanic membrane, ear canal and external ear normal.     Left Ear: Tympanic membrane, ear canal and external ear normal.     Nose: Congestion present.     Left Nostril: Epistaxis present.     Mouth/Throat:     Mouth: Mucous membranes are moist.     Pharynx: No posterior oropharyngeal erythema.  Cardiovascular:     Rate and Rhythm: Normal rate and regular rhythm.     Pulses: Normal pulses.     Heart sounds: Normal heart sounds.  Pulmonary:     Effort: Pulmonary effort is normal.     Breath sounds: Normal breath sounds.  Neurological:     Mental Status: She is alert.      UC Treatments / Results  Labs (all labs ordered are listed, but only abnormal results are displayed) Labs Reviewed  RESP PANEL BY RT-PCR (RSV, FLU A&B, COVID)  [id number]    EKG   Radiology No results found.  Procedures Procedures (including critical care time)  Medications Ordered in UC Medications - No data to display  Initial Impression / Assessment and Plan / UC Course  I have reviewed the triage vital signs and the nursing notes.  Pertinent labs & imaging results that were available during my care of the patient were reviewed by me and considered in my medical decision making (see chart for details).  COVID-19  Vital signs are stable, low-grade fever of 99.5 noted in triage, patient is nontoxic nor in signs of distress, stable for outpatient treatment, COVID confirmed via PCR, negative for flu and RSV, quarantine guidelines reviewed, discussed with patient, antivirals prescribed as well as  Tessalon and Promethazine DM for management of cough to be used as needed, may use additional over-the-counter medications if deemed helpful, may follow-up if symptoms persist or worsen, work note given Final Clinical Impressions(s) / UC Diagnoses   Final diagnoses:  None   Discharge  Instructions   None    ED Prescriptions   None    PDMP not reviewed this encounter.   Valinda Hoar, NP 07/09/22 2004

## 2022-07-09 NOTE — Discharge Instructions (Addendum)
Your symptoms today are most likely being caused by a virus and should steadily improve in time it can take up tto10 days before you truly start to see a turnaround however things will get better  Test is positive, you will need to quarantine for 5 days, may return activity on Tuesday, January 16, if still having symptoms at that time please wear mask for an additional 5 days  You may use antiviral every morning and every evening for 5 days, this medicine ideally reduces the amount of virus antibody reduces your symptoms and timeline that you are sick, does not fully take away illness  May use Tessalon pill every 8 hours to help calm your coughing  You may use cough syrup every 6 hours as needed for additional comfort, be mindful this medication will make you feel sleepy    You can take Tylenol and/or Ibuprofen as needed for fever reduction and pain relief.   For cough: honey 1/2 to 1 teaspoon (you can dilute the honey in water or another fluid).  You can also use guaifenesin and dextromethorphan for cough. You can use a humidifier for chest congestion and cough.  If you don't have a humidifier, you can sit in the bathroom with the hot shower running.      For sore throat: try warm salt water gargles, cepacol lozenges, throat spray, warm tea or water with lemon/honey, popsicles or ice, or OTC cold relief medicine for throat discomfort.   For congestion: take a daily anti-histamine like Zyrtec, Claritin, and a oral decongestant, such as pseudoephedrine.  You can also use Flonase 1-2 sprays in each nostril daily.   It is important to stay hydrated: drink plenty of fluids (water, gatorade/powerade/pedialyte, juices, or teas) to keep your throat moisturized and help further relieve irritation/discomfort.

## 2022-07-13 ENCOUNTER — Telehealth: Payer: BC Managed Care – PPO | Admitting: Physician Assistant

## 2022-07-13 DIAGNOSIS — U071 COVID-19: Secondary | ICD-10-CM

## 2022-07-13 NOTE — Progress Notes (Signed)
Virtual Visit Consent   Kendra Martin, you are scheduled for a virtual visit with a [facility] provider today. Just as with appointments in the office, your consent must be obtained to participate. Your consent will be active for this visit and any virtual visit you may have with one of our providers in the next 365 days. If you have a MyChart account, a copy of this consent can be sent to you electronically.  As this is a virtual visit, video technology does not allow for your provider to perform a traditional examination. This may limit your provider's ability to fully assess your condition. If your provider identifies any concerns that need to be evaluated in person or the need to arrange testing (such as labs, EKG, etc.), we will make arrangements to do so. Although advances in technology are sophisticated, we cannot ensure that it will always work on either your end or our end. If the connection with a video visit is poor, the visit may have to be switched to a telephone visit. With either a video or telephone visit, we are not always able to ensure that we have a secure connection.  By engaging in this virtual visit, you consent to the provision of healthcare and authorize for your insurance to be billed (if applicable) for the services provided during this visit. Depending on your insurance coverage, you may receive a charge related to this service.  I need to obtain your verbal consent now. Are you willing to proceed with your visit today? Kendra Martin has provided verbal consent on 07/13/2022 for a virtual visit (video or telephone). Kendra Martin, [place]  Date: 07/13/2022 11:41 AM  Virtual Visit via Video Note   I, Kendra Martin, connected with  Manon Banbury  ([id number], 44/10/1980) on 07/13/22 at 11:30 AM EST by a video-enabled telemedicine application and verified that I am speaking with the correct person using two identifiers.  Location: Patient: Virtual Visit Location Patient:  Home Provider: Virtual Visit Location Provider: Home Office   I discussed the limitations of evaluation and management by telemedicine and the availability of in person appointments. The patient expressed understanding and agreed to proceed.    History of Present Illness: Kendra Martin is a 42 y.o. who identifies as a female who was assigned female at birth, and is being seen today for ongoing COVID symptoms past her 5 day mark. Was seen at [facility] on 1/11 and diagnosed COVID-19 + via PCR. Was started on Molnupiravir which she is still taking. She is still having persistent cough that is sometimes productive with continued headache. No fever as of today, even with decreased naproxen use. Denies SOB outside of a coughing spell. Was prescribed Tessalon and promethazine-DM but has not been using. Pulse Ox at home is 98%.  HPI: HPI  Problems: There are no problems to display for this patient.   Allergies:  Allergies  Allergen Reactions   Copper     Rash   Grass Extracts [Gramineae Pollens] Other (See Comments)   Isopropyl Myristate Dermatitis   Other Rash    Metals   Medications:  Current Outpatient Medications:    amphetamine-dextroamphetamine (ADDERALL XR) 25 MG 24 hr capsule, Take 1 capsule by mouth every morning., Disp: , Rfl:    benzonatate (TESSALON) 100 MG capsule, Take 1 capsule (100 mg total) by mouth every 8 (eight) hours., Disp: 21 capsule, Rfl: 0   ibuprofen (ADVIL,MOTRIN) 800 MG tablet, Take 1 tablet (800 mg total) by mouth every 8 (  eight) hours as needed., Disp: 30 tablet, Rfl: 0   ipratropium (ATROVENT) 0.06 % nasal spray, Place 2 sprays into both nostrils 4 (four) times daily., Disp: 15 mL, Rfl: 12   molnupiravir EUA (LAGEVRIO) 200 mg CAPS capsule, Take 4 capsules (800 mg total) by mouth 2 (two) times daily for 5 days., Disp: 40 capsule, Rfl: 0   NUVARING 0.12-0.015 MG/24HR vaginal ring, , Disp: , Rfl: 3   promethazine-dextromethorphan (PROMETHAZINE-DM) 6.25-15 MG/5ML syrup, Take 5  mLs by mouth 4 (four) times daily as needed for cough., Disp: 118 mL, Rfl: 0   sertraline (ZOLOFT) 100 MG tablet, Take 100 mg by mouth daily., Disp: , Rfl:    VYVANSE 50 MG capsule, Take 50 mg by mouth daily., Disp: , Rfl:   Observations/Objective: Patient is well-developed, well-nourished in no acute distress.  Resting comfortably at home.  Head is normocephalic, atraumatic.  No labored breathing. Speech is clear and coherent with logical content.  Patient is alert and oriented at baseline.   Assessment and Plan: 1. COVID-19  On last days dosing of Molnupiravir. She is to complete. Vitamin recommendations, OTC medications reviewed. Encouraged her to use prescription cough medications as directed as that is her biggest residual symptoms. Since she is still pretty symptomatic after her initial 5-day quarantine she is to continue quarantining until consistent improvement in symptoms (up to 5 more days) at which time she can end quarantine but should continue to mask until that total 10 day mark has been reached.  Follow Up Instructions: I discussed the assessment and treatment plan with the patient. The patient was provided an opportunity to ask questions and all were answered. The patient agreed with the plan and demonstrated an understanding of the instructions.  A copy of instructions were sent to the patient via MyChart unless otherwise noted below.   The patient was advised to call back or seek an in-person evaluation if the symptoms worsen or if the condition fails to improve as anticipated.  Time:  I spent 12 minutes with the patient via telehealth technology discussing the above problems/concerns.    Kendra Rio, PA-C

## 2022-07-13 NOTE — Patient Instructions (Signed)
Denzil Magnuson, thank you for joining [name], PA-C for today's virtual visit.  While this provider is not your primary care provider (PCP), if your PCP is located in our provider database this encounter information will be shared with them immediately following your visit.   [facility] account gives you access to today's visit and all your visits, tests, and labs performed at [facility] " click here if you don't have a [facility] account or go to [email or web address]  Consent: (Patient) Julienne Vogler provided verbal consent for this virtual visit at the beginning of the encounter.  Current Medications:  Current Outpatient Medications:    amphetamine-dextroamphetamine (ADDERALL XR) 25 MG 24 hr capsule, Take 1 capsule by mouth every morning., Disp: , Rfl:    benzonatate (TESSALON) 100 MG capsule, Take 1 capsule (100 mg total) by mouth every 8 (eight) hours., Disp: 21 capsule, Rfl: 0   ibuprofen (ADVIL,MOTRIN) 800 MG tablet, Take 1 tablet (800 mg total) by mouth every 8 (eight) hours as needed., Disp: 30 tablet, Rfl: 0   ipratropium (ATROVENT) 0.06 % nasal spray, Place 2 sprays into both nostrils 4 (four) times daily., Disp: 15 mL, Rfl: 12   molnupiravir EUA (LAGEVRIO) 200 mg CAPS capsule, Take 4 capsules (800 mg total) by mouth 2 (two) times daily for 5 days., Disp: 40 capsule, Rfl: 0   NUVARING 0.12-0.015 MG/24HR vaginal ring, , Disp: , Rfl: 3   promethazine-dextromethorphan (PROMETHAZINE-DM) 6.25-15 MG/5ML syrup, Take 5 mLs by mouth 4 (four) times daily as needed for cough., Disp: 118 mL, Rfl: 0   sertraline (ZOLOFT) 100 MG tablet, Take 100 mg by mouth daily., Disp: , Rfl:    VYVANSE 50 MG capsule, Take 50 mg by mouth daily., Disp: , Rfl:    Medications ordered in this encounter:  No orders of the defined types were placed in this encounter.    *If you need refills on other medications prior to your next appointment, please contact your  pharmacy*  Follow-Up: Call back or seek an in-person evaluation if the symptoms worsen or if the condition fails to improve as anticipated.  [place] [phone]  Other Instructions Please keep well-hydrated and get plenty of rest. Start a saline nasal rinse to flush out your nasal passages. You can use plain Mucinex to help thin congestion. If you have a humidifier, running in the bedroom at night. I want you to start OTC vitamin D3 1000 units daily, vitamin C 1000 mg daily, and a zinc supplement. Please take prescribed medications as directed.  You have been enrolled in a MyChart symptom monitoring program. Please answer these questions daily so we can keep track of how you are doing.  You were to quarantine for 5 days from onset of your symptoms.  After day 5, if you have had no fever and you are feeling better, you can end quarantine but need to mask for an additional 5 days. After day 5 if you have a fever or are having significant symptoms, please quarantine for full 10 days.  If you note any worsening of symptoms, any significant shortness of breath or any chest pain, please seek ER evaluation ASAP.  Please do not delay care!  COVID-19: What to Do if You Are Sick If you test positive and are an older adult or someone who is at high risk of getting very sick from COVID-19, treatment may be available. Contact a healthcare provider right away after a positive test  to determine if you are eligible, even if your symptoms are mild right now. You can also visit a Test to Treat location and, if eligible, receive a prescription from a provider. Don't delay: Treatment must be started within the first few days to be effective. If you have a fever, cough, or other symptoms, you might have COVID-19. Most people have mild illness and are able to recover at home. If you are sick: Keep track of your symptoms. If you have an emergency warning sign (including trouble breathing),  call 911. Steps to help prevent the spread of COVID-19 if you are sick If you are sick with COVID-19 or think you might have COVID-19, follow the steps below to care for yourself and to help protect other people in your home and community. Stay home except to get medical care Stay home. Most people with COVID-19 have mild illness and can recover at home without medical care. Do not leave your home, except to get medical care. Do not visit public areas and do not go to places where you are unable to wear a mask. Take care of yourself. Get rest and stay hydrated. Take over-the-counter medicines, such as acetaminophen, to help you feel better. Stay in touch with your doctor. Call before you get medical care. Be sure to get care if you have trouble breathing, or have any other emergency warning signs, or if you think it is an emergency. Avoid public transportation, ride-sharing, or taxis if possible. Get tested If you have symptoms of COVID-19, get tested. While waiting for test results, stay away from others, including staying apart from those living in your household. Get tested as soon as possible after your symptoms start. Treatments may be available for people with COVID-19 who are at risk for becoming very sick. Don't delay: Treatment must be started early to be effective--some treatments must begin within 5 days of your first symptoms. Contact your healthcare provider right away if your test result is positive to determine if you are eligible. Self-tests are one of several options for testing for the virus that causes COVID-19 and may be more convenient than laboratory-based tests and point-of-care tests. Ask your healthcare provider or your local health department if you need help interpreting your test results. You can visit your state, tribal, local, and territorial health department's website to look for the latest local information on testing sites. Separate yourself from other people As much  as possible, stay in a specific room and away from other people and pets in your home. If possible, you should use a separate bathroom. If you need to be around other people or animals in or outside of the home, wear a well-fitting mask. Tell your close contacts that they may have been exposed to COVID-19. An infected person can spread COVID-19 starting 48 hours (or 2 days) before the person has any symptoms or tests positive. By letting your close contacts know they may have been exposed to COVID-19, you are helping to protect everyone. See COVID-19 and Animals if you have questions about pets. If you are diagnosed with COVID-19, someone from the health department may call you. Answer the call to slow the spread. Monitor your symptoms Symptoms of COVID-19 include fever, cough, or other symptoms. Follow care instructions from your healthcare provider and local health department. Your local health authorities may give instructions on checking your symptoms and reporting information. When to seek emergency medical attention Look for emergency warning signs* for COVID-19. If someone is showing  any of these signs, seek emergency medical care immediately: Trouble breathing Persistent pain or pressure in the chest New confusion Inability to wake or stay awake Pale, gray, or blue-colored skin, lips, or nail beds, depending on skin tone *This list is not all possible symptoms. Please call your medical provider for any other symptoms that are severe or concerning to you. Call 911 or call ahead to your local emergency facility: Notify the operator that you are seeking care for someone who has or may have COVID-19. Call ahead before visiting your doctor Call ahead. Many medical visits for routine care are being postponed or done by phone or telemedicine. If you have a medical appointment that cannot be postponed, call your doctor's office, and tell them you have or may have COVID-19. This will help the office  protect themselves and other patients. If you are sick, wear a well-fitting mask You should wear a mask if you must be around other people or animals, including pets (even at home). Wear a mask with the best fit, protection, and comfort for you. You don't need to wear the mask if you are alone. If you can't put on a mask (because of trouble breathing, for example), cover your coughs and sneezes in some other way. Try to stay at least 6 feet away from other people. This will help protect the people around you. Masks should not be placed on young children under age 47 years, anyone who has trouble breathing, or anyone who is not able to remove the mask without help. Cover your coughs and sneezes Cover your mouth and nose with a tissue when you cough or sneeze. Throw away used tissues in a lined trash can. Immediately wash your hands with soap and water for at least 20 seconds. If soap and water are not available, clean your hands with an alcohol-based hand sanitizer that contains at least 60% alcohol. Clean your hands often Wash your hands often with soap and water for at least 20 seconds. This is especially important after blowing your nose, coughing, or sneezing; going to the bathroom; and before eating or preparing food. Use hand sanitizer if soap and water are not available. Use an alcohol-based hand sanitizer with at least 60% alcohol, covering all surfaces of your hands and rubbing them together until they feel dry. Soap and water are the best option, especially if hands are visibly dirty. Avoid touching your eyes, nose, and mouth with unwashed hands. Handwashing Tips Avoid sharing personal household items Do not share dishes, drinking glasses, cups, eating utensils, towels, or bedding with other people in your home. Wash these items thoroughly after using them with soap and water or put in the dishwasher. Clean surfaces in your home regularly Clean and disinfect high-touch surfaces (for  example, doorknobs, tables, handles, light switches, and countertops) in your "sick room" and bathroom. In shared spaces, you should clean and disinfect surfaces and items after each use by the person who is ill. If you are sick and cannot clean, a caregiver or other person should only clean and disinfect the area around you (such as your bedroom and bathroom) on an as needed basis. Your caregiver/other person should wait as long as possible (at least several hours) and wear a mask before entering, cleaning, and disinfecting shared spaces that you use. Clean and disinfect areas that may have blood, stool, or body fluids on them. Use household cleaners and disinfectants. Clean visible dirty surfaces with household cleaners containing soap or detergent. Then, use  a household disinfectant. Use a product from [employer] List N: Disinfectants for Coronavirus (XBJYN-82). Be sure to follow the instructions on the label to ensure safe and effective use of the product. Many products recommend keeping the surface wet with a disinfectant for a certain period of time (look at "contact time" on the product label). You may also need to wear personal protective equipment, such as gloves, depending on the directions on the product label. Immediately after disinfecting, wash your hands with soap and water for 20 seconds. For completed guidance on cleaning and disinfecting your home, visit Complete Disinfection Guidance. Take steps to improve ventilation at home Improve ventilation (air flow) at home to help prevent from spreading COVID-19 to other people in your household. Clear out COVID-19 virus particles in the air by opening windows, using air filters, and turning on fans in your home. Use this interactive tool to learn how to improve air flow in your home. When you can be around others after being sick with COVID-19 Deciding when you can be around others is different for different situations. Find out when you can  safely end home isolation. For any additional questions about your care, contact your healthcare provider or state or local health department. 09/17/2020 Content source: [facility] for Immunization and Respiratory Diseases ([facility]), Division of Viral Diseases This information is not intended to replace advice given to you by your health care provider. Make sure you discuss any questions you have with your health care provider. Document Revised: 10/31/2020 Document Reviewed: 10/31/2020 Elsevier Patient Education  2022 [employer].      If you have been instructed to have an in-person evaluation today at a local Urgent Care facility, please use the link below. It will take you to a list of all of our available [facility], including address, phone number and hours of operation. Please do not delay care.  [facility]  If you or a family member do not have a primary care provider, use the link below to schedule a visit and establish care. When you choose a [facility] primary care physician or advanced practice provider, you gain a long-term partner in health. Find a Primary Care Provider  Learn more about 's in-office and virtual care options: [facility]

## 2022-07-24 DIAGNOSIS — J309 Allergic rhinitis, unspecified: Secondary | ICD-10-CM | POA: Diagnosis not present

## 2022-07-24 DIAGNOSIS — Z0000 Encounter for general adult medical examination without abnormal findings: Secondary | ICD-10-CM | POA: Diagnosis not present

## 2022-07-24 DIAGNOSIS — F909 Attention-deficit hyperactivity disorder, unspecified type: Secondary | ICD-10-CM | POA: Diagnosis not present

## 2022-07-24 DIAGNOSIS — F419 Anxiety disorder, unspecified: Secondary | ICD-10-CM | POA: Diagnosis not present
